# Patient Record
Sex: Male | Born: 1985 | Race: White | Hispanic: No | Smoking: Never smoker
Health system: Southern US, Community
[De-identification: ages and names within clinical notes are randomized; demographics above are authoritative.]

## PROBLEM LIST (undated history)

## (undated) DIAGNOSIS — F909 Attention-deficit hyperactivity disorder, unspecified type: Secondary | ICD-10-CM

## (undated) DIAGNOSIS — I10 Essential (primary) hypertension: Secondary | ICD-10-CM

## (undated) DIAGNOSIS — E663 Overweight: Secondary | ICD-10-CM

## (undated) HISTORY — DX: Essential (primary) hypertension: I10

## (undated) HISTORY — DX: Overweight: E66.3

## (undated) HISTORY — PX: REPAIR ANKLE LIGAMENT: SUR1187

## (undated) HISTORY — PX: FINGER CLOSED REDUCTION: SHX1633

## (undated) HISTORY — PX: CARPAL TUNNEL RELEASE: SHX101

## (undated) HISTORY — PX: ORIF ANKLE FRACTURE BIMALLEOLAR: SUR920

---

## 2019-02-09 DIAGNOSIS — M25562 Pain in left knee: Secondary | ICD-10-CM | POA: Diagnosis not present

## 2019-02-09 DIAGNOSIS — Y9373 Activity, racquet and hand sports: Secondary | ICD-10-CM | POA: Diagnosis not present

## 2019-02-15 ENCOUNTER — Other Ambulatory Visit: Payer: Self-pay | Admitting: Orthopedic Surgery

## 2019-02-15 DIAGNOSIS — M25562 Pain in left knee: Secondary | ICD-10-CM

## 2019-02-20 ENCOUNTER — Ambulatory Visit
Admission: RE | Admit: 2019-02-20 | Discharge: 2019-02-20 | Disposition: A | Discharge status: Home / Self Care | Payer: BLUE CROSS/BLUE SHIELD | Source: Ambulatory Visit | Attending: Orthopedic Surgery | Admitting: Orthopedic Surgery

## 2019-02-20 ENCOUNTER — Other Ambulatory Visit: Payer: Self-pay

## 2019-02-20 DIAGNOSIS — S82015A Nondisplaced osteochondral fracture of left patella, initial encounter for closed fracture: Secondary | ICD-10-CM | POA: Diagnosis not present

## 2019-02-20 DIAGNOSIS — M25562 Pain in left knee: Secondary | ICD-10-CM | POA: Diagnosis not present

## 2019-02-21 DIAGNOSIS — M25562 Pain in left knee: Secondary | ICD-10-CM | POA: Diagnosis not present

## 2019-02-23 ENCOUNTER — Encounter (HOSPITAL_BASED_OUTPATIENT_CLINIC_OR_DEPARTMENT_OTHER): Payer: Self-pay | Admitting: *Deleted

## 2019-02-23 ENCOUNTER — Other Ambulatory Visit: Payer: Self-pay

## 2019-02-26 ENCOUNTER — Other Ambulatory Visit: Payer: Self-pay | Admitting: Orthopedic Surgery

## 2019-02-28 NOTE — Pre-Procedure Instructions (Signed)
Pt called and re screened for s/s of Covid-19 virus. Denies: SOB, fever, chills, head or body ache's, runny nose, sneezing or sore throat. Denies any travel out of the state in the last 3 weeks. States that no one in the home has any of the above symptoms. Instructions and updated arrival time of 0645 reviewed.

## 2019-03-01 ENCOUNTER — Other Ambulatory Visit: Payer: Self-pay

## 2019-03-01 ENCOUNTER — Ambulatory Visit (HOSPITAL_BASED_OUTPATIENT_CLINIC_OR_DEPARTMENT_OTHER): Payer: BLUE CROSS/BLUE SHIELD | Admitting: Anesthesiology

## 2019-03-01 ENCOUNTER — Encounter (HOSPITAL_BASED_OUTPATIENT_CLINIC_OR_DEPARTMENT_OTHER): Payer: Self-pay | Admitting: Emergency Medicine

## 2019-03-01 ENCOUNTER — Encounter (HOSPITAL_BASED_OUTPATIENT_CLINIC_OR_DEPARTMENT_OTHER)
Admission: RE | Disposition: A | Discharge status: Home / Self Care | Payer: Self-pay | Source: Home / Self Care | Attending: Orthopedic Surgery

## 2019-03-01 ENCOUNTER — Ambulatory Visit (HOSPITAL_BASED_OUTPATIENT_CLINIC_OR_DEPARTMENT_OTHER)
Admission: RE | Admit: 2019-03-01 | Discharge: 2019-03-01 | Disposition: A | Discharge status: Home / Self Care | Payer: BLUE CROSS/BLUE SHIELD | Attending: Orthopedic Surgery | Admitting: Orthopedic Surgery

## 2019-03-01 DIAGNOSIS — M2392 Unspecified internal derangement of left knee: Secondary | ICD-10-CM | POA: Diagnosis not present

## 2019-03-01 DIAGNOSIS — M958 Other specified acquired deformities of musculoskeletal system: Secondary | ICD-10-CM | POA: Insufficient documentation

## 2019-03-01 DIAGNOSIS — M93262 Osteochondritis dissecans, left knee: Secondary | ICD-10-CM | POA: Diagnosis not present

## 2019-03-01 DIAGNOSIS — Z79899 Other long term (current) drug therapy: Secondary | ICD-10-CM | POA: Diagnosis not present

## 2019-03-01 DIAGNOSIS — F909 Attention-deficit hyperactivity disorder, unspecified type: Secondary | ICD-10-CM | POA: Diagnosis not present

## 2019-03-01 HISTORY — DX: Attention-deficit hyperactivity disorder, unspecified type: F90.9

## 2019-03-01 HISTORY — PX: KNEE ARTHROSCOPY WITH OSTEOCHONDRAL DEFECT REPAIR: SHX6579

## 2019-03-01 SURGERY — ARTHROSCOPY, KNEE, WITH OSTEOCHONDRAL DEFECT REPAIR
Anesthesia: General | Site: Knee | Laterality: Left

## 2019-03-01 MED ORDER — CHLORHEXIDINE GLUCONATE 4 % EX LIQD: 60.0000 mL | Freq: Once | CUTANEOUS | Status: DC

## 2019-03-01 MED ORDER — DEXAMETHASONE SODIUM PHOSPHATE 10 MG/ML IJ SOLN
INTRAMUSCULAR | Status: AC
  Filled 2019-03-01: qty 1

## 2019-03-01 MED ORDER — SODIUM CHLORIDE 0.9 % IR SOLN
Status: DC | PRN
  Administered 2019-03-01: 3000 mL

## 2019-03-01 MED ORDER — MIDAZOLAM HCL 5 MG/5ML IJ SOLN
INTRAMUSCULAR | Status: DC | PRN
  Administered 2019-03-01: 2 mg via INTRAVENOUS

## 2019-03-01 MED ORDER — CEFAZOLIN SODIUM-DEXTROSE 2-4 GM/100ML-% IV SOLN
INTRAVENOUS | Status: AC
  Filled 2019-03-01: qty 100

## 2019-03-01 MED ORDER — OXYCODONE HCL 5 MG PO TABS
5.0000 mg | ORAL_TABLET | Freq: Once | ORAL | Status: AC | PRN
  Administered 2019-03-01: 5 mg via ORAL

## 2019-03-01 MED ORDER — KETOROLAC TROMETHAMINE 30 MG/ML IJ SOLN
INTRAMUSCULAR | Status: DC | PRN
  Administered 2019-03-01: 30 mg via INTRAVENOUS

## 2019-03-01 MED ORDER — FENTANYL CITRATE (PF) 100 MCG/2ML IJ SOLN: 50.0000 ug | INTRAMUSCULAR | Status: DC | PRN

## 2019-03-01 MED ORDER — KETOROLAC TROMETHAMINE 30 MG/ML IJ SOLN
INTRAMUSCULAR | Status: AC
  Filled 2019-03-01: qty 1

## 2019-03-01 MED ORDER — ONDANSETRON HCL 4 MG/2ML IJ SOLN
INTRAMUSCULAR | Status: DC | PRN
  Administered 2019-03-01: 4 mg via INTRAVENOUS

## 2019-03-01 MED ORDER — FENTANYL CITRATE (PF) 100 MCG/2ML IJ SOLN
25.0000 ug | INTRAMUSCULAR | Status: DC | PRN
  Administered 2019-03-01: 25 ug via INTRAVENOUS

## 2019-03-01 MED ORDER — OXYCODONE HCL 5 MG/5ML PO SOLN: 5.0000 mg | Freq: Once | ORAL | Status: AC | PRN

## 2019-03-01 MED ORDER — MIDAZOLAM HCL 2 MG/2ML IJ SOLN: 1.0000 mg | INTRAMUSCULAR | Status: DC | PRN

## 2019-03-01 MED ORDER — FENTANYL CITRATE (PF) 100 MCG/2ML IJ SOLN
INTRAMUSCULAR | Status: DC | PRN
  Administered 2019-03-01 (×3): 25 ug via INTRAVENOUS
  Administered 2019-03-01: 50 ug via INTRAVENOUS
  Administered 2019-03-01 (×3): 25 ug via INTRAVENOUS

## 2019-03-01 MED ORDER — LIDOCAINE HCL (CARDIAC) PF 100 MG/5ML IV SOSY
PREFILLED_SYRINGE | INTRAVENOUS | Status: DC | PRN
  Administered 2019-03-01: 100 mg via INTRAVENOUS

## 2019-03-01 MED ORDER — LACTATED RINGERS IV SOLN
INTRAVENOUS | Status: DC
  Administered 2019-03-01 (×2): via INTRAVENOUS

## 2019-03-01 MED ORDER — OXYCODONE HCL 5 MG PO TABS: 5.0000 mg | ORAL_TABLET | Freq: Four times a day (QID) | ORAL | 0 refills | Status: DC | PRN

## 2019-03-01 MED ORDER — CEFAZOLIN SODIUM-DEXTROSE 2-4 GM/100ML-% IV SOLN
2.0000 g | INTRAVENOUS | Status: AC
  Administered 2019-03-01: 2 g via INTRAVENOUS

## 2019-03-01 MED ORDER — ONDANSETRON HCL 4 MG/2ML IJ SOLN
INTRAMUSCULAR | Status: AC
  Filled 2019-03-01: qty 2

## 2019-03-01 MED ORDER — FENTANYL CITRATE (PF) 100 MCG/2ML IJ SOLN
INTRAMUSCULAR | Status: AC
  Filled 2019-03-01: qty 2

## 2019-03-01 MED ORDER — DEXAMETHASONE SODIUM PHOSPHATE 4 MG/ML IJ SOLN
INTRAMUSCULAR | Status: DC | PRN
  Administered 2019-03-01: 10 mg via INTRAVENOUS

## 2019-03-01 MED ORDER — ONDANSETRON HCL 4 MG/2ML IJ SOLN: 4.0000 mg | Freq: Once | INTRAMUSCULAR | Status: DC | PRN

## 2019-03-01 MED ORDER — MIDAZOLAM HCL 2 MG/2ML IJ SOLN
INTRAMUSCULAR | Status: AC
  Filled 2019-03-01: qty 2

## 2019-03-01 MED ORDER — IBUPROFEN 800 MG PO TABS: 800.0000 mg | ORAL_TABLET | Freq: Three times a day (TID) | ORAL | 0 refills | Status: DC | PRN

## 2019-03-01 MED ORDER — SCOPOLAMINE 1 MG/3DAYS TD PT72: 1.0000 | MEDICATED_PATCH | Freq: Once | TRANSDERMAL | Status: DC | PRN

## 2019-03-01 MED ORDER — PROPOFOL 10 MG/ML IV BOLUS
INTRAVENOUS | Status: AC
  Filled 2019-03-01: qty 20

## 2019-03-01 MED ORDER — PROPOFOL 10 MG/ML IV BOLUS
INTRAVENOUS | Status: DC | PRN
  Administered 2019-03-01: 200 mg via INTRAVENOUS

## 2019-03-01 MED ORDER — OXYCODONE HCL 5 MG PO TABS
ORAL_TABLET | ORAL | Status: AC
  Filled 2019-03-01: qty 1

## 2019-03-01 MED ORDER — LIDOCAINE 2% (20 MG/ML) 5 ML SYRINGE
INTRAMUSCULAR | Status: AC
  Filled 2019-03-01: qty 5

## 2019-03-01 SURGICAL SUPPLY — 51 items
BANDAGE ACE 6X5 VEL STRL LF (GAUZE/BANDAGES/DRESSINGS) ×2 IMPLANT
BANDAGE ESMARK 6X9 LF (GAUZE/BANDAGES/DRESSINGS) IMPLANT
BLADE MINI RND TIP GREEN BEAV (BLADE) IMPLANT
BLADE SURG 15 STRL LF DISP TIS (BLADE) ×1 IMPLANT
BLADE SURG 15 STRL SS (BLADE) ×1
BNDG ESMARK 6X9 LF (GAUZE/BANDAGES/DRESSINGS)
BURR OVAL 8 FLU 5.0X13 (MISCELLANEOUS) IMPLANT
COVER WAND RF STERILE (DRAPES) IMPLANT
DISSECTOR  3.8MM X 13CM (MISCELLANEOUS)
DISSECTOR 3.8MM X 13CM (MISCELLANEOUS) IMPLANT
DRAPE ARTHROSCOPY W/POUCH 90 (DRAPES) ×2 IMPLANT
DRAPE IMP U-DRAPE 54X76 (DRAPES) ×2 IMPLANT
DRAPE U-SHAPE 47X51 STRL (DRAPES) ×2 IMPLANT
DRSG PAD ABDOMINAL 8X10 ST (GAUZE/BANDAGES/DRESSINGS) ×2 IMPLANT
DURAPREP 26ML APPLICATOR (WOUND CARE) ×2 IMPLANT
ELECT MENISCUS 165MM 90D (ELECTRODE) IMPLANT
ELECT REM PT RETURN 9FT ADLT (ELECTROSURGICAL) ×2
ELECTRODE REM PT RTRN 9FT ADLT (ELECTROSURGICAL) ×1 IMPLANT
EXCALIBUR 3.8MM X 13CM (MISCELLANEOUS) ×2 IMPLANT
GAUZE SPONGE 4X4 12PLY STRL (GAUZE/BANDAGES/DRESSINGS) ×2 IMPLANT
GAUZE XEROFORM 1X8 LF (GAUZE/BANDAGES/DRESSINGS) ×2 IMPLANT
GLOVE BIO SURGEON STRL SZ7.5 (GLOVE) ×2 IMPLANT
GLOVE BIOGEL PI IND STRL 8 (GLOVE) ×1 IMPLANT
GLOVE BIOGEL PI IND STRL 8.5 (GLOVE) ×2 IMPLANT
GLOVE BIOGEL PI INDICATOR 8 (GLOVE) ×1
GLOVE BIOGEL PI INDICATOR 8.5 (GLOVE) ×2
GLOVE SURG ORTHO 8.0 STRL STRW (GLOVE) ×4 IMPLANT
GOWN STRL REUS W/ TWL LRG LVL3 (GOWN DISPOSABLE) ×1 IMPLANT
GOWN STRL REUS W/ TWL XL LVL3 (GOWN DISPOSABLE) IMPLANT
GOWN STRL REUS W/TWL LRG LVL3 (GOWN DISPOSABLE) ×1
GOWN STRL REUS W/TWL XL LVL3 (GOWN DISPOSABLE)
IMMOBILIZER KNEE 22 UNIV (SOFTGOODS) ×2 IMPLANT
IMMOBILIZER KNEE 24 THIGH 36 (MISCELLANEOUS) IMPLANT
IMMOBILIZER KNEE 24 UNIV (MISCELLANEOUS)
KNEE WRAP E Z 3 GEL PACK (MISCELLANEOUS) ×2 IMPLANT
MANIFOLD NEPTUNE II (INSTRUMENTS) ×4 IMPLANT
PACK ARTHROSCOPY DSU (CUSTOM PROCEDURE TRAY) ×2 IMPLANT
PACK BASIN DAY SURGERY FS (CUSTOM PROCEDURE TRAY) ×2 IMPLANT
PAD CAST 4YDX4 CTTN HI CHSV (CAST SUPPLIES) ×1 IMPLANT
PADDING CAST COTTON 4X4 STRL (CAST SUPPLIES) ×1
PADDING CAST COTTON 6X4 STRL (CAST SUPPLIES) ×2 IMPLANT
PENCIL BUTTON HOLSTER BLD 10FT (ELECTRODE) IMPLANT
PORT APPOLLO RF 90DEGREE MULTI (SURGICAL WAND) ×2 IMPLANT
SPONGE LAP 4X18 RFD (DISPOSABLE) ×2 IMPLANT
SUCTION FRAZIER HANDLE 10FR (MISCELLANEOUS) ×1
SUCTION TUBE FRAZIER 10FR DISP (MISCELLANEOUS) ×1 IMPLANT
SUT ETHILON 4 0 PS 2 18 (SUTURE) ×2 IMPLANT
TOWEL GREEN STERILE FF (TOWEL DISPOSABLE) ×2 IMPLANT
TUBING ARTHROSCOPY IRRIG 16FT (MISCELLANEOUS) ×2 IMPLANT
WATER STERILE IRR 1000ML POUR (IV SOLUTION) ×2 IMPLANT
YANKAUER SUCT BULB TIP NO VENT (SUCTIONS) IMPLANT

## 2019-03-01 NOTE — Transfer of Care (Signed)
Immediate Anesthesia Transfer of Care Note  Patient: Walter Roberson  Procedure(s) Performed: Procedure(s) (LRB): LEFT KNEE ARTHROSCOPY WITH OSTEOCHARAL DEFECT FIXATION MICROFRACTURE (Left)  Patient Location: PACU  Anesthesia Type: General  Level of Consciousness: awake, sedated, patient cooperative and responds to stimulation  Airway & Oxygen Therapy: Patient Spontanous Breathing and Patient connected to  oxygen w/ cloth mask over O2  Post-op Assessment: Report given to PACU RN, Post -op Vital signs reviewed and stable and Patient moving all extremities  Post vital signs: Reviewed and stable  Complications: No apparent anesthesia complications

## 2019-03-01 NOTE — H&P (Signed)
Walter Roberson MRN:  [id number] DOB/SEX:  10-18-1986/male  CHIEF COMPLAINT:  Painful left Knee  HISTORY: Patient is a 33 y.o. male presented with a history of pain in the left knee. Onset of symptoms was abrupt starting a few weeks ago with gradually worsening course since that time. Patient has been treated conservatively with over-the-counter NSAIDs and activity modification. Patient currently rates pain in the knee at 10 out of 10 with activity. There is pain at night.  PAST MEDICAL HISTORY: There are no active problems to display for this patient.  Past Medical History:  Diagnosis Date  . ADHD    Past Surgical History:  Procedure Laterality Date  . CARPAL TUNNEL RELEASE Right   . FINGER CLOSED REDUCTION    . ORIF ANKLE FRACTURE BIMALLEOLAR Left    x2  . REPAIR ANKLE LIGAMENT Left      MEDICATIONS:   Medications Prior to Admission  Medication Sig Dispense Refill Last Dose  . amphetamine-dextroamphetamine (ADDERALL XR) 30 MG 24 hr capsule Take 30 mg by mouth daily.   02/28/2019 at Unknown time  . naproxen sodium (ALEVE) 220 MG tablet Take 220 mg by mouth 2 (two) times daily as needed.   02/25/2019    ALLERGIES:  No Known Allergies  REVIEW OF SYSTEMS:  A comprehensive review of systems was negative except for: Musculoskeletal: positive for bone pain and myalgias   FAMILY HISTORY:  History reviewed. No pertinent family history.  SOCIAL HISTORY:   Social History   Tobacco Use  . Smoking status: Never Smoker  . Smokeless tobacco: Never Used  Substance Use Topics  . Alcohol use: Yes    Comment: occas     EXAMINATION:  Vital signs in last 24 hours: Temp:  [97.5 F (36.4 C)] 97.5 F (36.4 C) (04/22 0719) Pulse Rate:  [84] 84 (04/22 0719) Resp:  [16] 16 (04/22 0719) BP: (134)/(96) 134/96 (04/22 0719) SpO2:  [99 %] 99 % (04/22 0719) Weight:  [86.6 kg] 86.6 kg (04/22 0719)  BP (!) 134/96   Pulse 84   Temp (!) 97.5 F (36.4 C) (Oral)   Resp 16   Ht 5\' 8"  (1.727 m)    Wt 86.6 kg   SpO2 99%   BMI 29.03 kg/m   General Appearance:    Alert, cooperative, no distress, appears stated age  Head:    Normocephalic, without obvious abnormality, atraumatic  Eyes:    PERRL, conjunctiva/corneas clear, EOM's intact, fundi    benign, both eyes       Ears:    Normal TM's and external ear canals, both ears  Nose:   Nares normal, septum midline, mucosa normal, no drainage    or sinus tenderness  Throat:   Lips, mucosa, and tongue normal; teeth and gums normal  Neck:   Supple, symmetrical, trachea midline, no adenopathy;       thyroid:  No enlargement/tenderness/nodules; no carotid   bruit or JVD  Back:     Symmetric, no curvature, ROM normal, no CVA tenderness  Lungs:     Clear to auscultation bilaterally, respirations unlabored  Chest wall:    No tenderness or deformity  Heart:    Regular rate and rhythm, S1 and S2 normal, no murmur, rub   or gallop  Abdomen:     Soft, non-tender, bowel sounds active all four quadrants,    no masses, no organomegaly  Genitalia:    Normal male without lesion, discharge or tenderness  Rectal:    Normal tone, normal  prostate, no masses or tenderness;   guaiac negative stool  Extremities:   Extremities normal, atraumatic, no cyanosis or edema  Pulses:   2+ and symmetric all extremities  Skin:   Skin color, texture, turgor normal, no rashes or lesions  Lymph nodes:   Cervical, supraclavicular, and axillary nodes normal  Neurologic:   CNII-XII intact. 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 The overall alignment is neutral. The bone quality appears to be good for age and reported activity level.  Assessment/Plan: OCD fracture, left knee   The patient history, physical examination and imaging studies are consistent with OCD fracture left knee. The patient has failed  conservative treatment.  The clearance notes were reviewed.  After discussion with the patient it was felt that OCD fixation was indicated. The procedure,  risks, and benefits were presented and reviewed. The patient acknowledged the explanation, agreed to proceed with the plan.   Guy SandiferColby Alan Dazani Norby 03/01/2019, 8:11 AM

## 2019-03-01 NOTE — Discharge Instructions (Signed)
  Post Anesthesia Home Care Instructions  Activity: Get plenty of rest for the remainder of the day. A responsible individual must stay with you for 24 hours following the procedure.  For the next 24 hours, DO NOT: -Drive a car -Operate machinery -Drink alcoholic beverages -Take any medication unless instructed by your physician -Make any legal decisions or sign important papers.  Meals: Start with liquid foods such as gelatin or soup. Progress to regular foods as tolerated. Avoid greasy, spicy, heavy foods. If nausea and/or vomiting occur, drink only clear liquids until the nausea and/or vomiting subsides. Call your physician if vomiting continues.  Special Instructions/Symptoms: Your throat may feel dry or sore from the anesthesia or the breathing tube placed in your throat during surgery. If this causes discomfort, gargle with warm salt water. The discomfort should disappear within 24 hours.     Regional Anesthesia Blocks  1. Numbness or the inability to move the "blocked" extremity may last from 3-48 hours after placement. The length of time depends on the medication injected and your individual response to the medication. If the numbness is not going away after 48 hours, call your surgeon.  2. The extremity that is blocked will need to be protected until the numbness is gone and the  Strength has returned. Because you cannot feel it, you will need to take extra care to avoid injury. Because it may be weak, you may have difficulty moving it or using it. You may not know what position it is in without looking at it while the block is in effect.  3. For blocks in the legs and feet, returning to weight bearing and walking needs to be done carefully. You will need to wait until the numbness is entirely gone and the strength has returned. You should be able to move your leg and foot normally before you try and bear weight or walk. You will need someone to be with you when you first try to  ensure you do not fall and possibly risk injury.  4. Bruising and tenderness at the needle site are common side effects and will resolve in a few days.  5. Persistent numbness or new problems with movement should be communicated to the surgeon or the [facility] ([phone])/ [facility] ([phone]). 

## 2019-03-01 NOTE — Anesthesia Procedure Notes (Signed)
Procedure Name: LMA Insertion Date/Time: 03/01/2019 8:57 AM Performed by: Jessica Priest, CRNA Pre-anesthesia Checklist: Patient identified, Emergency Drugs available, Suction available and Patient being monitored Patient Re-evaluated:Patient Re-evaluated prior to induction Oxygen Delivery Method: Circle system utilized Preoxygenation: Pre-oxygenation with 100% oxygen Induction Type: IV induction Ventilation: Mask ventilation without difficulty LMA: LMA inserted LMA Size: 4.0 Number of attempts: 1 Airway Equipment and Method: Bite block Placement Confirmation: positive ETCO2 and breath sounds checked- equal and bilateral Tube secured with: Tape Dental Injury: Teeth and Oropharynx as per pre-operative assessment

## 2019-03-01 NOTE — Op Note (Signed)
-  Stable CBC white attending operative note on patient Walter Roberson, [place], GER a.m. 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  Now has had mechanical symptoms and pain.  MRI scan and x-ray revealed a 9 x 12 osteochondral defect.  Description of procedure:  Patient was taken the operating room placed supine on the operating room table.  The left lower extremity was prepped and draped in usual fashion.  #11 blade was used to make inferior lateral and inferomedial portals.  Diagnostic arthroscopy revealed no other pathology in the knee other than what was on the medial femoral condyle.  I used the 3.2 mm shaver through the inferomedial portal to debride the articular cartilage which was nonviable.  Ultimately the lesion measured approximately 14 mm wide by 48mm anterior to posterior.  Thus, too large for an osteochondral autograft transplant.  I did investigate the other areas of the knee and the medial and lateral menisci were normal as was the articular cartilage on the lateral and patellofemoral surfaces.  I used the 45 degree microfracture awl and punctured 8 holes in the bed of the OCD lesion.  And I did this after using a 4.0 mm cylindrical bur to get down to a good bleeding subchondral subchondral plate.  Then lavaged and closed for nylon sutures.  Dressed with Xeroform dressing sponges sterile web roll and an Ace wrap.  Complications none drains none M dictation

## 2019-03-01 NOTE — Anesthesia Preprocedure Evaluation (Signed)
Anesthesia Evaluation  Patient identified by MRN, date of birth, ID band Patient awake    Reviewed: Allergy & Precautions, NPO status , Patient's Chart, lab work & pertinent test results  History of Anesthesia Complications Negative for: history of anesthetic complications  Airway Mallampati: I  TM Distance: >3 FB Neck ROM: Full    Dental no notable dental hx. (+) Teeth Intact   Pulmonary neg pulmonary ROS,    Pulmonary exam normal        Cardiovascular negative cardio ROS Normal cardiovascular exam     Neuro/Psych negative neurological ROS  negative psych ROS   GI/Hepatic negative GI ROS, Neg liver ROS,   Endo/Other  negative endocrine ROS  Renal/GU negative Renal ROS  negative genitourinary   Musculoskeletal negative musculoskeletal ROS (+)   Abdominal   Peds  (+) ADHD Hematology negative hematology ROS (+)   Anesthesia Other Findings   Reproductive/Obstetrics                            Anesthesia Physical Anesthesia Plan  ASA: I  Anesthesia Plan: General   Post-op Pain Management:    Induction: Intravenous  PONV Risk Score and Plan: 2 and Ondansetron, Dexamethasone, Midazolam and Treatment may vary due to age or medical condition  Airway Management Planned: LMA  Additional Equipment: None  Intra-op Plan:   Post-operative Plan: Extubation in OR  Informed Consent: I have reviewed the patients History and Physical, chart, labs and discussed the procedure including the risks, benefits and alternatives for the proposed anesthesia with the patient or authorized representative who has indicated his/her understanding and acceptance.     Dental advisory given  Plan Discussed with:   Anesthesia Plan Comments:       Anesthesia Quick Evaluation

## 2019-03-01 NOTE — Anesthesia Postprocedure Evaluation (Signed)
Anesthesia Post Note  Patient: Walter Roberson  Procedure(s) Performed: LEFT KNEE ARTHROSCOPY WITH OSTEOCHARAL DEFECT FIXATION MICROFRACTURE (Left Knee)     Patient location during evaluation: PACU Anesthesia Type: General Level of consciousness: awake and alert Pain management: pain level controlled Vital Signs Assessment: post-procedure vital signs reviewed and stable Respiratory status: spontaneous breathing, nonlabored ventilation and respiratory function stable Cardiovascular status: blood pressure returned to baseline and stable Postop Assessment: no apparent nausea or vomiting Anesthetic complications: no    Last Vitals:  Vitals:   03/01/19 1030 03/01/19 1045  BP: (!) 130/100 (!) 136/99  Pulse: 87 83  Resp: 13 16  Temp:  36.6 C  SpO2: 99% 100%    Last Pain:  Vitals:   03/01/19 1045  TempSrc:   PainSc: 6                  Lucretia Kern

## 2019-03-02 ENCOUNTER — Encounter (HOSPITAL_BASED_OUTPATIENT_CLINIC_OR_DEPARTMENT_OTHER): Payer: Self-pay | Admitting: Orthopedic Surgery

## 2019-03-06 DIAGNOSIS — R262 Difficulty in walking, not elsewhere classified: Secondary | ICD-10-CM | POA: Diagnosis not present

## 2019-03-06 DIAGNOSIS — M6281 Muscle weakness (generalized): Secondary | ICD-10-CM | POA: Diagnosis not present

## 2019-03-06 DIAGNOSIS — M25462 Effusion, left knee: Secondary | ICD-10-CM | POA: Diagnosis not present

## 2019-03-06 DIAGNOSIS — M25662 Stiffness of left knee, not elsewhere classified: Secondary | ICD-10-CM | POA: Diagnosis not present

## 2019-03-09 DIAGNOSIS — M6281 Muscle weakness (generalized): Secondary | ICD-10-CM | POA: Diagnosis not present

## 2019-03-09 DIAGNOSIS — M25662 Stiffness of left knee, not elsewhere classified: Secondary | ICD-10-CM | POA: Diagnosis not present

## 2019-03-09 DIAGNOSIS — R262 Difficulty in walking, not elsewhere classified: Secondary | ICD-10-CM | POA: Diagnosis not present

## 2019-03-09 DIAGNOSIS — M25462 Effusion, left knee: Secondary | ICD-10-CM | POA: Diagnosis not present

## 2019-03-15 DIAGNOSIS — M25662 Stiffness of left knee, not elsewhere classified: Secondary | ICD-10-CM | POA: Diagnosis not present

## 2019-03-15 DIAGNOSIS — M6281 Muscle weakness (generalized): Secondary | ICD-10-CM | POA: Diagnosis not present

## 2019-03-15 DIAGNOSIS — R262 Difficulty in walking, not elsewhere classified: Secondary | ICD-10-CM | POA: Diagnosis not present

## 2019-03-15 DIAGNOSIS — M25462 Effusion, left knee: Secondary | ICD-10-CM | POA: Diagnosis not present

## 2019-03-22 DIAGNOSIS — M25662 Stiffness of left knee, not elsewhere classified: Secondary | ICD-10-CM | POA: Diagnosis not present

## 2019-03-22 DIAGNOSIS — R262 Difficulty in walking, not elsewhere classified: Secondary | ICD-10-CM | POA: Diagnosis not present

## 2019-03-22 DIAGNOSIS — M25462 Effusion, left knee: Secondary | ICD-10-CM | POA: Diagnosis not present

## 2019-03-22 DIAGNOSIS — M6281 Muscle weakness (generalized): Secondary | ICD-10-CM | POA: Diagnosis not present

## 2019-04-12 ENCOUNTER — Ambulatory Visit: Payer: BLUE CROSS/BLUE SHIELD | Admitting: Internal Medicine

## 2019-04-12 DIAGNOSIS — M25462 Effusion, left knee: Secondary | ICD-10-CM | POA: Diagnosis not present

## 2019-04-12 DIAGNOSIS — M6281 Muscle weakness (generalized): Secondary | ICD-10-CM | POA: Diagnosis not present

## 2019-04-12 DIAGNOSIS — M25662 Stiffness of left knee, not elsewhere classified: Secondary | ICD-10-CM | POA: Diagnosis not present

## 2019-04-12 DIAGNOSIS — R262 Difficulty in walking, not elsewhere classified: Secondary | ICD-10-CM | POA: Diagnosis not present

## 2019-04-18 ENCOUNTER — Ambulatory Visit (INDEPENDENT_AMBULATORY_CARE_PROVIDER_SITE_OTHER): Payer: BC Managed Care – PPO | Admitting: Internal Medicine

## 2019-04-18 ENCOUNTER — Other Ambulatory Visit: Payer: Self-pay

## 2019-04-18 ENCOUNTER — Encounter: Payer: Self-pay | Admitting: Internal Medicine

## 2019-04-18 VITALS — BP 150/100 | HR 98 | Temp 98.3°F | Ht 68.0 in | Wt 195.4 lb

## 2019-04-18 DIAGNOSIS — E663 Overweight: Secondary | ICD-10-CM

## 2019-04-18 DIAGNOSIS — F900 Attention-deficit hyperactivity disorder, predominantly inattentive type: Secondary | ICD-10-CM

## 2019-04-18 DIAGNOSIS — I10 Essential (primary) hypertension: Secondary | ICD-10-CM | POA: Diagnosis not present

## 2019-04-18 DIAGNOSIS — F909 Attention-deficit hyperactivity disorder, unspecified type: Secondary | ICD-10-CM | POA: Insufficient documentation

## 2019-04-18 DIAGNOSIS — Z23 Encounter for immunization: Secondary | ICD-10-CM | POA: Diagnosis not present

## 2019-04-18 MED ORDER — HYDROCHLOROTHIAZIDE 25 MG PO TABS: 25.0000 mg | ORAL_TABLET | Freq: Every day | ORAL | 1 refills | Status: DC

## 2019-04-18 NOTE — Progress Notes (Signed)
New Patient Office Visit     CC/Reason for Visit: Establish care, annual refills, discuss high blood pressure Previous PCP: Unknown Last Visit: Unknown  HPI: Walter Roberson is a 33 y.o. male who is coming in today for the above mentioned reasons.  He has recently moved to [place].  He works at [employer].  His past medical history is significant for ADHD for which he takes Adderall 30 mg in the morning, he also is a little overweight.  He recently had left knee surgery due to a meniscal tear, he has done well with rehab following surgery.  Throughout his surgical process he was told on several occasions that his blood pressure was high and advised to follow-up with PCP which is the main reason for visit today.  His family history is significant for a brother with type 1 diabetes diagnosed at the age of 56, father with hypertension.  He is a never smoker, has 2 beers twice a week.   Past Medical/Surgical History: Past Medical History:  Diagnosis Date   ADHD    HTN (hypertension)    Overweight (BMI 25.0-29.9)     Past Surgical History:  Procedure Laterality Date   CARPAL TUNNEL RELEASE Right    FINGER CLOSED REDUCTION     KNEE ARTHROSCOPY WITH OSTEOCHONDRAL DEFECT REPAIR Left 03/01/2019   Procedure: LEFT KNEE ARTHROSCOPY WITH OSTEOCHARAL DEFECT FIXATION MICROFRACTURE;  Surgeon: Vickey Huger, MD;  Location: [facility];  Service: Orthopedics;  Laterality: Left;   ORIF ANKLE FRACTURE BIMALLEOLAR Left    x2   REPAIR ANKLE LIGAMENT Left     Social History:  reports that he has never smoked. He has never used smokeless tobacco. He reports current alcohol use of about 4.0 standard drinks of alcohol per week. He reports that he does not use drugs.  Allergies: No Known Allergies  Family History:  Family History  Problem Relation Age of Onset   Hypertension Father    Diabetes Brother      Current Outpatient Medications:    amphetamine-dextroamphetamine  (ADDERALL XR) 30 MG 24 hr capsule, Take 30 mg by mouth daily., Disp: , Rfl:    amphetamine-dextroamphetamine (ADDERALL XR) 30 MG 24 hr capsule, Take by mouth., Disp: , Rfl:    hydrochlorothiazide (HYDRODIURIL) 25 MG tablet, Take 1 tablet (25 mg total) by mouth daily., Disp: 90 tablet, Rfl: 1  Review of Systems:  Constitutional: Denies fever, chills, diaphoresis, appetite change and fatigue.  HEENT: Denies photophobia, eye pain, redness, hearing loss, ear pain, congestion, sore throat, rhinorrhea, sneezing, mouth sores, trouble swallowing, neck pain, neck stiffness and tinnitus.   Respiratory: Denies SOB, DOE, cough, chest tightness,  and wheezing.   Cardiovascular: Denies chest pain, palpitations and leg swelling.  Gastrointestinal: Denies nausea, vomiting, abdominal pain, diarrhea, constipation, blood in stool and abdominal distention.  Genitourinary: Denies dysuria, urgency, frequency, hematuria, flank pain and difficulty urinating.  Endocrine: Denies: hot or cold intolerance, sweats, changes in hair or nails, polyuria, polydipsia. Musculoskeletal: Denies myalgias, back pain, joint swelling, arthralgias and gait problem.  Skin: Denies pallor, rash and wound.  Neurological: Denies dizziness, seizures, syncope, weakness, light-headedness, numbness and headaches.  Hematological: Denies adenopathy. Easy bruising, personal or family bleeding history  Psychiatric/Behavioral: Denies suicidal ideation, mood changes, confusion, nervousness, sleep disturbance and agitation    Physical Exam: Vitals:   04/18/19 0738 04/18/19 0758  BP: 140/90 (!) 150/100  Pulse: 98   Temp: 98.3 F (36.8 C)   TempSrc: Oral   SpO2: 97%  Weight: 195 lb 6.4 oz (88.6 kg)   Height: _0  (1.727 m)    Body mass index is 29.71 kg/m.  Constitutional: NAD, calm, comfortable Eyes: PERRL, lids and conjunctivae normal ENMT: Mucous membranes are moist. Neck: normal, supple, no masses, no thyromegaly Respiratory:  clear to auscultation bilaterally, no wheezing, no crackles. Normal respiratory effort. No accessory muscle use.  Cardiovascular: Regular rate and rhythm, no murmurs / rubs / gallops. No extremity edema. 2+ pedal pulses. No carotid bruits.  Abdomen: no tenderness, no masses palpated. No hepatosplenomegaly. Bowel sounds positive.  Musculoskeletal: no clubbing / cyanosis. No joint deformity upper and lower extremities. Good ROM, no contractures. Normal muscle tone.  Skin: no rashes, lesions, ulcers. No induration Neurologic: CN 2-12 grossly intact. Sensation intact, DTR normal. Strength 5/5 in all 4.  Psychiatric: Normal judgment and insight. Alert and oriented x 3. Normal mood.    Impression and Plan:  Essential hypertension  -I am comfortable making this diagnosis today based on in office blood pressures as well as repeated reports of high blood pressure in the past 3 months. -Will start hydrochlorothiazide 25 mg daily, check baseline renal function and electrolytes today. -Patient has met his maximum out-of-pocket deductible for his insurance which renews on July 1, because of this he is requesting blood work from physical today, will return in 1 month for blood pressure check and physical. -Advised to do ambulatory blood pressure monitoring.  Attention deficit hyperactivity disorder (ADHD), predominantly inattentive type -He is on Adderall 30 mg daily, does not need refills today, will discuss at follow-up visit.  Overweight (BMI 25.0-29.9) -Discussed healthy lifestyle, including increased physical activity and better food choices to promote weight loss.      Patient Instructions  -Nice meeting you today!!  -Start taking HCTZ 25 mg once daily. Low sodium diet: have attached some information.  -Tetanus immunization today.  -Come back later this week fasting for blood work.  -Follow up in 1 month.   DASH Eating Plan DASH stands for "Dietary Approaches to Stop Hypertension."  The DASH eating plan is a healthy eating plan that has been shown to reduce high blood pressure (hypertension). It may also reduce your risk for type 2 diabetes, heart disease, and stroke. The DASH eating plan may also help with weight loss. What are tips for following this plan?  General guidelines  Avoid eating more than 2,300 mg (milligrams) of salt (sodium) a day. If you have hypertension, you may need to reduce your sodium intake to 1,500 mg a day.  Limit alcohol intake to no more than 1 drink a day for nonpregnant women and 2 drinks a day for men. One drink equals 12 oz of beer, 5 oz of wine, or 1 oz of hard liquor.  Work with your health care provider to maintain a healthy body weight or to lose weight. Ask what an ideal weight is for you.  Get at least 30 minutes of exercise that causes your heart to beat faster (aerobic exercise) most days of the week. Activities may include walking, swimming, or biking.  Work with your health care provider or diet and nutrition specialist (dietitian) to adjust your eating plan to your individual calorie needs. Reading food labels   Check food labels for the amount of sodium per serving. Choose foods with less than 5 percent of the Daily Value of sodium. Generally, foods with less than 300 mg of sodium per serving fit into this eating plan.  To find whole grains, look  for the word "whole" as the first word in the ingredient list. Shopping  Buy products labeled as "low-sodium" or "no salt added."  Buy fresh foods. Avoid canned foods and premade or frozen meals. Cooking  Avoid adding salt when cooking. Use salt-free seasonings or herbs instead of table salt or sea salt. Check with your health care provider or pharmacist before using salt substitutes.  Do not fry foods. Cook foods using healthy methods such as baking, boiling, grilling, and broiling instead.  Cook with heart-healthy oils, such as olive, canola, soybean, or sunflower oil. Meal  planning  Eat a balanced diet that includes: ? 5 or more servings of fruits and vegetables each day. At each meal, try to fill half of your plate with fruits and vegetables. ? Up to 6-8 servings of whole grains each day. ? Less than 6 oz of lean meat, poultry, or fish each day. A 3-oz serving of meat is about the same size as a deck of cards. One egg equals 1 oz. ? 2 servings of low-fat dairy each day. ? A serving of nuts, seeds, or beans 5 times each week. ? Heart-healthy fats. Healthy fats called Omega-3 fatty acids are found in foods such as flaxseeds and coldwater fish, like sardines, salmon, and mackerel.  Limit how much you eat of the following: ? Canned or prepackaged foods. ? Food that is high in trans fat, such as fried foods. ? Food that is high in saturated fat, such as fatty meat. ? Sweets, desserts, sugary drinks, and other foods with added sugar. ? Full-fat dairy products.  Do not salt foods before eating.  Try to eat at least 2 vegetarian meals each week.  Eat more home-cooked food and less restaurant, buffet, and fast food.  When eating at a restaurant, ask that your food be prepared with less salt or no salt, if possible. What foods are recommended? The items listed may not be a complete list. Talk with your dietitian about what dietary choices are best for you. Grains Whole-grain or whole-wheat bread. Whole-grain or whole-wheat pasta. Brown rice. [place]row. Bulgur. Whole-grain and low-sodium cereals. Pita bread. Low-fat, low-sodium crackers. Whole-wheat flour tortillas. Vegetables Fresh or frozen vegetables (raw, steamed, roasted, or grilled). Low-sodium or reduced-sodium tomato and vegetable juice. Low-sodium or reduced-sodium tomato sauce and tomato paste. Low-sodium or reduced-sodium canned vegetables. Fruits All fresh, dried, or frozen fruit. Canned fruit in natural juice (without added sugar). Meat and other protein foods Skinless chicken or [place].  Ground chicken or [place]. Pork with fat trimmed off. Fish and seafood. Egg whites. Dried beans, peas, or lentils. Unsalted nuts, nut butters, and seeds. Unsalted canned beans. Lean cuts of beef with fat trimmed off. Low-sodium, lean deli meat. Dairy Low-fat (1%) or fat-free (skim) milk. Fat-free, low-fat, or reduced-fat cheeses. Nonfat, low-sodium ricotta or cottage cheese. Low-fat or nonfat yogurt. Low-fat, low-sodium cheese. Fats and oils Soft margarine without trans fats. Vegetable oil. Low-fat, reduced-fat, or light mayonnaise and salad dressings (reduced-sodium). Canola, safflower, olive, soybean, and sunflower oils. Avocado. Seasoning and other foods Herbs. Spices. Seasoning mixes without salt. Unsalted popcorn and pretzels. Fat-free sweets. What foods are not recommended? The items listed may not be a complete list. Talk with your dietitian about what dietary choices are best for you. Grains Baked goods made with fat, such as croissants, muffins, or some breads. Dry pasta or rice meal packs. Vegetables Creamed or fried vegetables. Vegetables in a cheese sauce. Regular canned vegetables (not low-sodium or reduced-sodium). Regular canned  tomato sauce and paste (not low-sodium or reduced-sodium). Regular tomato and vegetable juice (not low-sodium or reduced-sodium). Angie Fava. Olives. Fruits Canned fruit in a light or heavy syrup. Fried fruit. Fruit in cream or butter sauce. Meat and other protein foods Fatty cuts of meat. Ribs. Fried meat. Berniece Salines. Sausage. Bologna and other processed lunch meats. Salami. Fatback. Hotdogs. Bratwurst. Salted nuts and seeds. Canned beans with added salt. Canned or smoked fish. Whole eggs or egg yolks. Chicken or [place] with skin. Dairy Whole or 2% milk, cream, and half-and-half. Whole or full-fat cream cheese. Whole-fat or sweetened yogurt. Full-fat cheese. Nondairy creamers. Whipped toppings. Processed cheese and cheese spreads. Fats and oils Butter. Stick  margarine. Lard. Shortening. Ghee. Bacon fat. Tropical oils, such as coconut, palm kernel, or palm oil. Seasoning and other foods Salted popcorn and pretzels. Onion salt, garlic salt, seasoned salt, table salt, and sea salt. Worcestershire sauce. Tartar sauce. Barbecue sauce. Teriyaki sauce. Soy sauce, including reduced-sodium. Steak sauce. Canned and packaged gravies. Fish sauce. Oyster sauce. Cocktail sauce. Horseradish that you find on the shelf. Ketchup. Mustard. Meat flavorings and tenderizers. Bouillon cubes. Hot sauce and Tabasco sauce. Premade or packaged marinades. Premade or packaged taco seasonings. Relishes. Regular salad dressings. Where to find more information:  National Heart, Lung, and [place]: https://wilson-eaton.com/  American Heart Association: www.heart.org Summary  The DASH eating plan is a healthy eating plan that has been shown to reduce high blood pressure (hypertension). It may also reduce your risk for type 2 diabetes, heart disease, and stroke.  With the DASH eating plan, you should limit salt (sodium) intake to 2,300 mg a day. If you have hypertension, you may need to reduce your sodium intake to 1,500 mg a day.  When on the DASH eating plan, aim to eat more fresh fruits and vegetables, whole grains, lean proteins, low-fat dairy, and heart-healthy fats.  Work with your health care provider or diet and nutrition specialist (dietitian) to adjust your eating plan to your individual calorie needs. This information is not intended to replace advice given to you by your health care provider. Make sure you discuss any questions you have with your health care provider. Document Released: 10/15/2011 Document Revised: 10/19/2016 Document Reviewed: 10/19/2016 Elsevier Interactive Patient Education  2019 [place], MD [facility]

## 2019-04-18 NOTE — Patient Instructions (Addendum)
-Nice meeting you today!!  -Start taking HCTZ 25 mg once daily. Low sodium diet: have attached some information.  -Tetanus immunization today.  -Come back later this week fasting for blood work.  -Follow up in 1 month.   DASH Eating Plan DASH stands for "Dietary Approaches to Stop Hypertension." The DASH eating plan is a healthy eating plan that has been shown to reduce high blood pressure (hypertension). It may also reduce your risk for type 2 diabetes, heart disease, and stroke. The DASH eating plan may also help with weight loss. What are tips for following this plan?  General guidelines  Avoid eating more than 2,300 mg (milligrams) of salt (sodium) a day. If you have hypertension, you may need to reduce your sodium intake to 1,500 mg a day.  Limit alcohol intake to no more than 1 drink a day for nonpregnant women and 2 drinks a day for men. One drink equals 12 oz of beer, 5 oz of wine, or 1 oz of hard liquor.  Work with your health care provider to maintain a healthy body weight or to lose weight. Ask what an ideal weight is for you.  Get at least 30 minutes of exercise that causes your heart to beat faster (aerobic exercise) most days of the week. Activities may include walking, swimming, or biking.  Work with your health care provider or diet and nutrition specialist (dietitian) to adjust your eating plan to your individual calorie needs. Reading food labels   Check food labels for the amount of sodium per serving. Choose foods with less than 5 percent of the Daily Value of sodium. Generally, foods with less than 300 mg of sodium per serving fit into this eating plan.  To find whole grains, look for the word "whole" as the first word in the ingredient list. Shopping  Buy products labeled as "low-sodium" or "no salt added."  Buy fresh foods. Avoid canned foods and premade or frozen meals. Cooking  Avoid adding salt when cooking. Use salt-free seasonings or herbs instead  of table salt or sea salt. Check with your health care provider or pharmacist before using salt substitutes.  Do not fry foods. Cook foods using healthy methods such as baking, boiling, grilling, and broiling instead.  Cook with heart-healthy oils, such as olive, canola, soybean, or sunflower oil. Meal planning  Eat a balanced diet that includes: ? 5 or more servings of fruits and vegetables each day. At each meal, try to fill half of your plate with fruits and vegetables. ? Up to 6-8 servings of whole grains each day. ? Less than 6 oz of lean meat, poultry, or fish each day. A 3-oz serving of meat is about the same size as a deck of cards. One egg equals 1 oz. ? 2 servings of low-fat dairy each day. ? A serving of nuts, seeds, or beans 5 times each week. ? Heart-healthy fats. Healthy fats called Omega-3 fatty acids are found in foods such as flaxseeds and coldwater fish, like sardines, salmon, and mackerel.  Limit how much you eat of the following: ? Canned or prepackaged foods. ? Food that is high in trans fat, such as fried foods. ? Food that is high in saturated fat, such as fatty meat. ? Sweets, desserts, sugary drinks, and other foods with added sugar. ? Full-fat dairy products.  Do not salt foods before eating.  Try to eat at least 2 vegetarian meals each week.  Eat more home-cooked food and less restaurant, buffet,  and fast food.  When eating at a restaurant, ask that your food be prepared with less salt or no salt, if possible. What foods are recommended? The items listed may not be a complete list. Talk with your dietitian about what dietary choices are best for you. Grains Whole-grain or whole-wheat bread. Whole-grain or whole-wheat pasta. Brown rice. [place]. Bulgur. Whole-grain and low-sodium cereals. Pita bread. Low-fat, low-sodium crackers. Whole-wheat flour tortillas. Vegetables Fresh or frozen vegetables (raw, steamed, roasted, or grilled). Low-sodium or  reduced-sodium tomato and vegetable juice. Low-sodium or reduced-sodium tomato sauce and tomato paste. Low-sodium or reduced-sodium canned vegetables. Fruits All fresh, dried, or frozen fruit. Canned fruit in natural juice (without added sugar). Meat and other protein foods Skinless chicken or [place]. Ground chicken or [place]. Pork with fat trimmed off. Fish and seafood. Egg whites. Dried beans, peas, or lentils. Unsalted nuts, nut butters, and seeds. Unsalted canned beans. Lean cuts of beef with fat trimmed off. Low-sodium, lean deli meat. Dairy Low-fat (1%) or fat-free (skim) milk. Fat-free, low-fat, or reduced-fat cheeses. Nonfat, low-sodium ricotta or cottage cheese. Low-fat or nonfat yogurt. Low-fat, low-sodium cheese. Fats and oils Soft margarine without trans fats. Vegetable oil. Low-fat, reduced-fat, or light mayonnaise and salad dressings (reduced-sodium). Canola, safflower, olive, soybean, and sunflower oils. Avocado. Seasoning and other foods Herbs. Spices. Seasoning mixes without salt. Unsalted popcorn and pretzels. Fat-free sweets. What foods are not recommended? The items listed may not be a complete list. Talk with your dietitian about what dietary choices are best for you. Grains Baked goods made with fat, such as croissants, muffins, or some breads. Dry pasta or rice meal packs. Vegetables Creamed or fried vegetables. Vegetables in a cheese sauce. Regular canned vegetables (not low-sodium or reduced-sodium). Regular canned tomato sauce and paste (not low-sodium or reduced-sodium). Regular tomato and vegetable juice (not low-sodium or reduced-sodium). Angie Fava. Olives. Fruits Canned fruit in a light or heavy syrup. Fried fruit. Fruit in cream or butter sauce. Meat and other protein foods Fatty cuts of meat. Ribs. Fried meat. Berniece Salines. Sausage. Bologna and other processed lunch meats. Salami. Fatback. Hotdogs. Bratwurst. Salted nuts and seeds. Canned beans with added salt. Canned or  smoked fish. Whole eggs or egg yolks. Chicken or [place] with skin. Dairy Whole or 2% milk, cream, and half-and-half. Whole or full-fat cream cheese. Whole-fat or sweetened yogurt. Full-fat cheese. Nondairy creamers. Whipped toppings. Processed cheese and cheese spreads. Fats and oils Butter. Stick margarine. Lard. Shortening. Ghee. Bacon fat. Tropical oils, such as coconut, palm kernel, or palm oil. Seasoning and other foods Salted popcorn and pretzels. Onion salt, garlic salt, seasoned salt, table salt, and sea salt. Worcestershire sauce. Tartar sauce. Barbecue sauce. Teriyaki sauce. Soy sauce, including reduced-sodium. Steak sauce. Canned and packaged gravies. Fish sauce. Oyster sauce. Cocktail sauce. Horseradish that you find on the shelf. Ketchup. Mustard. Meat flavorings and tenderizers. Bouillon cubes. Hot sauce and Tabasco sauce. Premade or packaged marinades. Premade or packaged taco seasonings. Relishes. Regular salad dressings. Where to find more information:  National Heart, Lung, and [place]: https://wilson-eaton.com/  American Heart Association: www.heart.org Summary  The DASH eating plan is a healthy eating plan that has been shown to reduce high blood pressure (hypertension). It may also reduce your risk for type 2 diabetes, heart disease, and stroke.  With the DASH eating plan, you should limit salt (sodium) intake to 2,300 mg a day. If you have hypertension, you may need to reduce your sodium intake to 1,500 mg a day.  When on  the DASH eating plan, aim to eat more fresh fruits and vegetables, whole grains, lean proteins, low-fat dairy, and heart-healthy fats.  Work with your health care provider or diet and nutrition specialist (dietitian) to adjust your eating plan to your individual calorie needs. This information is not intended to replace advice given to you by your health care provider. Make sure you discuss any questions you have with your health care provider. Document  Released: 10/15/2011 Document Revised: 10/19/2016 Document Reviewed: 10/19/2016 Elsevier Interactive Patient Education  2019 [employer]

## 2019-04-18 NOTE — Addendum Note (Signed)
Addended by: Westley Hummer B on: 04/18/2019 08:52 AM   Modules accepted: Orders

## 2019-04-19 DIAGNOSIS — M6281 Muscle weakness (generalized): Secondary | ICD-10-CM | POA: Diagnosis not present

## 2019-04-19 DIAGNOSIS — M25462 Effusion, left knee: Secondary | ICD-10-CM | POA: Diagnosis not present

## 2019-04-19 DIAGNOSIS — M25662 Stiffness of left knee, not elsewhere classified: Secondary | ICD-10-CM | POA: Diagnosis not present

## 2019-04-19 DIAGNOSIS — R262 Difficulty in walking, not elsewhere classified: Secondary | ICD-10-CM | POA: Diagnosis not present

## 2019-04-20 ENCOUNTER — Other Ambulatory Visit: Payer: Self-pay

## 2019-04-20 ENCOUNTER — Other Ambulatory Visit (INDEPENDENT_AMBULATORY_CARE_PROVIDER_SITE_OTHER): Payer: BC Managed Care – PPO

## 2019-04-20 DIAGNOSIS — I10 Essential (primary) hypertension: Secondary | ICD-10-CM

## 2019-04-21 LAB — COMPREHENSIVE METABOLIC PANEL
ALT: 34 U/L (ref 0–53)
AST: 23 U/L (ref 0–37)
Albumin: 4.8 g/dL (ref 3.5–5.2)
Alkaline Phosphatase: 90 U/L (ref 39–117)
BUN: 16 mg/dL (ref 6–23)
CO2: 29 mEq/L (ref 19–32)
Calcium: 10 mg/dL (ref 8.4–10.5)
Chloride: 99 mEq/L (ref 96–112)
Creatinine, Ser: 1.33 mg/dL (ref 0.40–1.50)
GFR: 61.59 mL/min (ref >60.00)
Glucose, Bld: 91 mg/dL (ref 70–99)
Potassium: 4.3 mEq/L (ref 3.5–5.1)
Sodium: 139 mEq/L (ref 135–145)
Total Bilirubin: 0.9 mg/dL (ref 0.2–1.2)
Total Protein: 7.3 g/dL (ref 6.0–8.3)

## 2019-04-21 LAB — HEMOGLOBIN A1C: Hgb A1c MFr Bld: 5.3 % (ref 4.6–6.5)

## 2019-04-21 LAB — CBC WITH DIFFERENTIAL/PLATELET
Basophils Absolute: 0.1 10*3/uL (ref 0.0–0.1)
Basophils Relative: 0.9 % (ref 0.0–3.0)
Eosinophils Absolute: 0.1 10*3/uL (ref 0.0–0.7)
Eosinophils Relative: 1.5 % (ref 0.0–5.0)
HCT: 46.9 % (ref 39.0–52.0)
Hemoglobin: 16.3 g/dL (ref 13.0–17.0)
Lymphocytes Relative: 23.3 % (ref 12.0–46.0)
Lymphs Abs: 2 10*3/uL (ref 0.7–4.0)
MCHC: 34.7 g/dL (ref 30.0–36.0)
MCV: 92 fl (ref 78.0–100.0)
Monocytes Absolute: 0.6 10*3/uL (ref 0.1–1.0)
Monocytes Relative: 6.7 % (ref 3.0–12.0)
Neutro Abs: 5.8 10*3/uL (ref 1.4–7.7)
Neutrophils Relative %: 67.6 % (ref 43.0–77.0)
Platelets: 230 10*3/uL (ref 150.0–400.0)
RBC: 5.1 Mil/uL (ref 4.22–5.81)
RDW: 12.9 % (ref 11.5–15.5)
WBC: 8.6 10*3/uL (ref 4.0–10.5)

## 2019-04-21 LAB — LIPID PANEL
Cholesterol: 220 mg/dL — ABNORMAL HIGH (ref 0–200)
HDL: 57.8 mg/dL (ref >39.00)
LDL Cholesterol: 144 mg/dL — ABNORMAL HIGH (ref 0–99)
NonHDL: 162.13
Total CHOL/HDL Ratio: 4
Triglycerides: 92 mg/dL (ref 0.0–149.0)
VLDL: 18.4 mg/dL (ref 0.0–40.0)

## 2019-04-21 LAB — VITAMIN B12: Vitamin B-12: 398 pg/mL (ref 211–911)

## 2019-04-21 LAB — VITAMIN D 25 HYDROXY (VIT D DEFICIENCY, FRACTURES): VITD: 42.91 ng/mL (ref 30.00–100.00)

## 2019-04-21 LAB — TSH: TSH: 1.8 u[IU]/mL (ref 0.35–4.50)

## 2019-04-25 ENCOUNTER — Encounter: Payer: Self-pay | Admitting: Internal Medicine

## 2019-04-26 DIAGNOSIS — M25662 Stiffness of left knee, not elsewhere classified: Secondary | ICD-10-CM | POA: Diagnosis not present

## 2019-04-26 DIAGNOSIS — R262 Difficulty in walking, not elsewhere classified: Secondary | ICD-10-CM | POA: Diagnosis not present

## 2019-04-26 DIAGNOSIS — M25462 Effusion, left knee: Secondary | ICD-10-CM | POA: Diagnosis not present

## 2019-04-26 DIAGNOSIS — M6281 Muscle weakness (generalized): Secondary | ICD-10-CM | POA: Diagnosis not present

## 2019-05-03 DIAGNOSIS — M25462 Effusion, left knee: Secondary | ICD-10-CM | POA: Diagnosis not present

## 2019-05-03 DIAGNOSIS — R262 Difficulty in walking, not elsewhere classified: Secondary | ICD-10-CM | POA: Diagnosis not present

## 2019-05-03 DIAGNOSIS — M6281 Muscle weakness (generalized): Secondary | ICD-10-CM | POA: Diagnosis not present

## 2019-05-03 DIAGNOSIS — M25662 Stiffness of left knee, not elsewhere classified: Secondary | ICD-10-CM | POA: Diagnosis not present

## 2019-05-10 DIAGNOSIS — M25662 Stiffness of left knee, not elsewhere classified: Secondary | ICD-10-CM | POA: Diagnosis not present

## 2019-05-10 DIAGNOSIS — M25462 Effusion, left knee: Secondary | ICD-10-CM | POA: Diagnosis not present

## 2019-05-10 DIAGNOSIS — R262 Difficulty in walking, not elsewhere classified: Secondary | ICD-10-CM | POA: Diagnosis not present

## 2019-05-10 DIAGNOSIS — M6281 Muscle weakness (generalized): Secondary | ICD-10-CM | POA: Diagnosis not present

## 2019-05-16 ENCOUNTER — Encounter: Payer: Self-pay | Admitting: Internal Medicine

## 2019-05-16 NOTE — Telephone Encounter (Signed)
Last office visit 04/18/2019

## 2019-05-17 DIAGNOSIS — R262 Difficulty in walking, not elsewhere classified: Secondary | ICD-10-CM | POA: Diagnosis not present

## 2019-05-17 DIAGNOSIS — M25662 Stiffness of left knee, not elsewhere classified: Secondary | ICD-10-CM | POA: Diagnosis not present

## 2019-05-17 DIAGNOSIS — M6281 Muscle weakness (generalized): Secondary | ICD-10-CM | POA: Diagnosis not present

## 2019-05-17 DIAGNOSIS — M25462 Effusion, left knee: Secondary | ICD-10-CM | POA: Diagnosis not present

## 2019-05-18 MED ORDER — AMPHETAMINE-DEXTROAMPHET ER 30 MG PO CP24: 30.0000 mg | ORAL_CAPSULE | Freq: Every day | ORAL | 0 refills | Status: DC

## 2019-05-18 MED ORDER — AMPHETAMINE-DEXTROAMPHET ER 30 MG PO CP24: 30.0000 mg | ORAL_CAPSULE | ORAL | 0 refills | Status: DC

## 2019-05-24 DIAGNOSIS — M25462 Effusion, left knee: Secondary | ICD-10-CM | POA: Diagnosis not present

## 2019-05-24 DIAGNOSIS — M25662 Stiffness of left knee, not elsewhere classified: Secondary | ICD-10-CM | POA: Diagnosis not present

## 2019-05-24 DIAGNOSIS — M6281 Muscle weakness (generalized): Secondary | ICD-10-CM | POA: Diagnosis not present

## 2019-05-24 DIAGNOSIS — R262 Difficulty in walking, not elsewhere classified: Secondary | ICD-10-CM | POA: Diagnosis not present

## 2019-07-14 ENCOUNTER — Encounter: Payer: Self-pay | Admitting: Internal Medicine

## 2019-07-14 ENCOUNTER — Other Ambulatory Visit: Payer: Self-pay | Admitting: Internal Medicine

## 2019-07-21 DIAGNOSIS — M1712 Unilateral primary osteoarthritis, left knee: Secondary | ICD-10-CM | POA: Diagnosis not present

## 2019-08-08 DIAGNOSIS — Z20828 Contact with and (suspected) exposure to other viral communicable diseases: Secondary | ICD-10-CM | POA: Diagnosis not present

## 2019-08-08 DIAGNOSIS — J039 Acute tonsillitis, unspecified: Secondary | ICD-10-CM | POA: Diagnosis not present

## 2019-08-28 ENCOUNTER — Encounter: Payer: Self-pay | Admitting: Internal Medicine

## 2019-09-05 ENCOUNTER — Ambulatory Visit (INDEPENDENT_AMBULATORY_CARE_PROVIDER_SITE_OTHER): Payer: BC Managed Care – PPO | Admitting: Internal Medicine

## 2019-09-05 ENCOUNTER — Other Ambulatory Visit: Payer: Self-pay

## 2019-09-05 DIAGNOSIS — F900 Attention-deficit hyperactivity disorder, predominantly inattentive type: Secondary | ICD-10-CM

## 2019-09-05 DIAGNOSIS — I10 Essential (primary) hypertension: Secondary | ICD-10-CM

## 2019-09-05 MED ORDER — AMPHETAMINE-DEXTROAMPHET ER 30 MG PO CP24: 30.0000 mg | ORAL_CAPSULE | ORAL | 0 refills | Status: DC

## 2019-09-05 MED ORDER — AMPHETAMINE-DEXTROAMPHET ER 30 MG PO CP24: 30.0000 mg | ORAL_CAPSULE | Freq: Every day | ORAL | 0 refills | Status: DC

## 2019-09-05 NOTE — Progress Notes (Signed)
Virtual Visit via Video Note  I connected with Walter Roberson on 09/05/19 at  2:30 PM EDT by a video enabled telemedicine application and verified that I am speaking with the correct person using two identifiers.  Location patient: home Location provider: work office Persons participating in the virtual visit: patient, provider  I discussed the limitations of evaluation and management by telemedicine and the availability of in person appointments. The patient expressed understanding and agreed to proceed.   HPI: He has scheduled this visit for BP follow up and for Adderall refills. He was started on HCTZ 25 mg at last visit. States BP at home has been in the 120/80s. Takes Adderall 30 mg q am for his ADHD. Tells me his knee is still causing issues and he will be going in for an MRI soon. His ortho is talking about possibly a knee replacement.    ROS: Constitutional: Denies fever, chills, diaphoresis, appetite change and fatigue.  HEENT: Denies photophobia, eye pain, redness, hearing loss, ear pain, congestion, sore throat, rhinorrhea, sneezing, mouth sores, trouble swallowing, neck pain, neck stiffness and tinnitus.   Respiratory: Denies SOB, DOE, cough, chest tightness,  and wheezing.   Cardiovascular: Denies chest pain, palpitations and leg swelling.  Gastrointestinal: Denies nausea, vomiting, abdominal pain, diarrhea, constipation, blood in stool and abdominal distention.  Genitourinary: Denies dysuria, urgency, frequency, hematuria, flank pain and difficulty urinating.  Endocrine: Denies: hot or cold intolerance, sweats, changes in hair or nails, polyuria, polydipsia. Musculoskeletal: Denies myalgias, back pain. Skin: Denies pallor, rash and wound.  Neurological: Denies dizziness, seizures, syncope, weakness, light-headedness, numbness and headaches.  Hematological: Denies adenopathy. Easy bruising, personal or family bleeding history  Psychiatric/Behavioral: Denies suicidal  ideation, mood changes, confusion, nervousness, sleep disturbance and agitation   Past Medical History:  Diagnosis Date  . ADHD   . HTN (hypertension)   . Overweight (BMI 25.0-29.9)     Past Surgical History:  Procedure Laterality Date  . CARPAL TUNNEL RELEASE Right   . FINGER CLOSED REDUCTION    . KNEE ARTHROSCOPY WITH OSTEOCHONDRAL DEFECT REPAIR Left 03/01/2019   Procedure: LEFT KNEE ARTHROSCOPY WITH OSTEOCHARAL DEFECT FIXATION MICROFRACTURE;  Surgeon: Dannielle Huh, MD;  Location: [facility];  Service: Orthopedics;  Laterality: Left;  . ORIF ANKLE FRACTURE BIMALLEOLAR Left    x2  . REPAIR ANKLE LIGAMENT Left     Family History  Problem Relation Age of Onset  . Hypertension Father   . Diabetes Brother     SOCIAL HX:   reports that he has never smoked. He has never used smokeless tobacco. He reports current alcohol use of about 4.0 standard drinks of alcohol per week. He reports that he does not use drugs.   Current Outpatient Medications:  .  amphetamine-dextroamphetamine (ADDERALL XR) 30 MG 24 hr capsule, Take 1 capsule (30 mg total) by mouth every morning., Disp: 30 capsule, Rfl: 0 .  amphetamine-dextroamphetamine (ADDERALL XR) 30 MG 24 hr capsule, Take 1 capsule (30 mg total) by mouth daily., Disp: 30 capsule, Rfl: 0 .  amphetamine-dextroamphetamine (ADDERALL XR) 30 MG 24 hr capsule, Take 1 capsule (30 mg total) by mouth daily., Disp: 30 capsule, Rfl: 0 .  hydrochlorothiazide (HYDRODIURIL) 25 MG tablet, Take 1 tablet (25 mg total) by mouth daily., Disp: 90 tablet, Rfl: 1  EXAM:   VITALS per patient if applicable: none reported  GENERAL: alert, oriented, appears well and in no acute distress  HEENT: atraumatic, conjunttiva clear, no obvious abnormalities on inspection  of external nose and ears  NECK: normal movements of the head and neck  LUNGS: on inspection no signs of respiratory distress, breathing rate appears normal, no obvious gross increased  work of breathing, gasping or wheezing  CV: no obvious cyanosis  MS: moves all visible extremities without noticeable abnormality  PSYCH/NEURO: pleasant and cooperative, no obvious depression or anxiety, speech and thought processing grossly intact  ASSESSMENT AND PLAN:   Essential hypertension -Reported good control. -In office visit in 3 months for BP check and BMET to check renal function and electrolytes on diuretic therapy.  Attention deficit hyperactivity disorder (ADHD), predominantly inattentive type -PDMP reviewed; no red flags ORS 280. -Refill Adderall x 3 months.    I discussed the assessment and treatment plan with the patient. The patient was provided an opportunity to ask questions and all were answered. The patient agreed with the plan and demonstrated an understanding of the instructions.   The patient was advised to call back or seek an in-person evaluation if the symptoms worsen or if the condition fails to improve as anticipated.    Lelon Frohlich, MD  [facility]

## 2019-10-02 ENCOUNTER — Other Ambulatory Visit: Payer: Self-pay | Admitting: Internal Medicine

## 2019-10-02 DIAGNOSIS — F900 Attention-deficit hyperactivity disorder, predominantly inattentive type: Secondary | ICD-10-CM

## 2019-10-03 ENCOUNTER — Encounter: Payer: Self-pay | Admitting: Internal Medicine

## 2019-10-03 ENCOUNTER — Other Ambulatory Visit: Payer: Self-pay | Admitting: Internal Medicine

## 2019-10-03 DIAGNOSIS — F900 Attention-deficit hyperactivity disorder, predominantly inattentive type: Secondary | ICD-10-CM

## 2019-10-03 NOTE — Telephone Encounter (Signed)
Spoke to pharmacist Colon Branch at [facility] and a new e-script will have to be sent to approve an early refill.

## 2019-10-04 MED ORDER — AMPHETAMINE-DEXTROAMPHET ER 30 MG PO CP24: 30.0000 mg | ORAL_CAPSULE | ORAL | 0 refills | Status: DC

## 2019-10-07 DIAGNOSIS — Z20828 Contact with and (suspected) exposure to other viral communicable diseases: Secondary | ICD-10-CM | POA: Diagnosis not present

## 2019-10-07 DIAGNOSIS — J0100 Acute maxillary sinusitis, unspecified: Secondary | ICD-10-CM | POA: Diagnosis not present

## 2019-10-14 ENCOUNTER — Encounter (HOSPITAL_COMMUNITY): Payer: Self-pay | Admitting: Emergency Medicine

## 2019-10-14 ENCOUNTER — Other Ambulatory Visit: Payer: Self-pay

## 2019-10-14 ENCOUNTER — Emergency Department (HOSPITAL_COMMUNITY)
Admission: EM | Admit: 2019-10-14 | Discharge: 2019-10-14 | Disposition: A | Discharge status: Home / Self Care | Payer: BLUE CROSS/BLUE SHIELD | Attending: Emergency Medicine | Admitting: Emergency Medicine

## 2019-10-14 ENCOUNTER — Emergency Department (HOSPITAL_BASED_OUTPATIENT_CLINIC_OR_DEPARTMENT_OTHER): Payer: BLUE CROSS/BLUE SHIELD

## 2019-10-14 ENCOUNTER — Emergency Department (HOSPITAL_COMMUNITY): Payer: BLUE CROSS/BLUE SHIELD

## 2019-10-14 DIAGNOSIS — M791 Myalgia, unspecified site: Secondary | ICD-10-CM

## 2019-10-14 DIAGNOSIS — R531 Weakness: Secondary | ICD-10-CM | POA: Diagnosis not present

## 2019-10-14 DIAGNOSIS — R000 Tachycardia, unspecified: Secondary | ICD-10-CM | POA: Diagnosis not present

## 2019-10-14 DIAGNOSIS — R05 Cough: Secondary | ICD-10-CM | POA: Insufficient documentation

## 2019-10-14 DIAGNOSIS — F909 Attention-deficit hyperactivity disorder, unspecified type: Secondary | ICD-10-CM | POA: Insufficient documentation

## 2019-10-14 DIAGNOSIS — Z79899 Other long term (current) drug therapy: Secondary | ICD-10-CM | POA: Insufficient documentation

## 2019-10-14 DIAGNOSIS — R509 Fever, unspecified: Secondary | ICD-10-CM | POA: Insufficient documentation

## 2019-10-14 DIAGNOSIS — U071 COVID-19: Secondary | ICD-10-CM | POA: Diagnosis not present

## 2019-10-14 DIAGNOSIS — R609 Edema, unspecified: Secondary | ICD-10-CM | POA: Diagnosis not present

## 2019-10-14 DIAGNOSIS — I10 Essential (primary) hypertension: Secondary | ICD-10-CM | POA: Insufficient documentation

## 2019-10-14 DIAGNOSIS — M7918 Myalgia, other site: Secondary | ICD-10-CM | POA: Insufficient documentation

## 2019-10-14 DIAGNOSIS — R059 Cough, unspecified: Secondary | ICD-10-CM

## 2019-10-14 DIAGNOSIS — R5383 Other fatigue: Secondary | ICD-10-CM | POA: Insufficient documentation

## 2019-10-14 LAB — CBC WITH DIFFERENTIAL/PLATELET
Abs Immature Granulocytes: 0.02 10*3/uL (ref 0.00–0.07)
Basophils Absolute: 0 10*3/uL (ref 0.0–0.1)
Basophils Relative: 0 %
Eosinophils Absolute: 0 10*3/uL (ref 0.0–0.5)
Eosinophils Relative: 0 %
HCT: 48.4 % (ref 39.0–52.0)
Hemoglobin: 16.8 g/dL (ref 13.0–17.0)
Immature Granulocytes: 0 %
Lymphocytes Relative: 11 %
Lymphs Abs: 0.6 10*3/uL — ABNORMAL LOW (ref 0.7–4.0)
MCH: 31.2 pg (ref 26.0–34.0)
MCHC: 34.7 g/dL (ref 30.0–36.0)
MCV: 89.8 fL (ref 80.0–100.0)
Monocytes Absolute: 0.3 10*3/uL (ref 0.1–1.0)
Monocytes Relative: 6 %
Neutro Abs: 4.6 10*3/uL (ref 1.7–7.7)
Neutrophils Relative %: 83 %
Platelets: 150 10*3/uL (ref 150–400)
RBC: 5.39 MIL/uL (ref 4.22–5.81)
RDW: 12.1 % (ref 11.5–15.5)
WBC: 5.6 10*3/uL (ref 4.0–10.5)
nRBC: 0 % (ref 0.0–0.2)

## 2019-10-14 LAB — COMPREHENSIVE METABOLIC PANEL
ALT: 37 U/L (ref 0–44)
AST: 36 U/L (ref 15–41)
Albumin: 3.7 g/dL (ref 3.5–5.0)
Alkaline Phosphatase: 61 U/L (ref 38–126)
Anion gap: 11 (ref 5–15)
BUN: 11 mg/dL (ref 6–20)
CO2: 27 mmol/L (ref 22–32)
Calcium: 8.8 mg/dL — ABNORMAL LOW (ref 8.9–10.3)
Chloride: 95 mmol/L — ABNORMAL LOW (ref 98–111)
Creatinine, Ser: 1.17 mg/dL (ref 0.61–1.24)
GFR calc Af Amer: >60 mL/min (ref >60)
GFR calc non Af Amer: >60 mL/min (ref >60)
Glucose, Bld: 141 mg/dL — ABNORMAL HIGH (ref 70–99)
Potassium: 3.9 mmol/L (ref 3.5–5.1)
Sodium: 133 mmol/L — ABNORMAL LOW (ref 135–145)
Total Bilirubin: 0.9 mg/dL (ref 0.3–1.2)
Total Protein: 7.3 g/dL (ref 6.5–8.1)

## 2019-10-14 LAB — LACTIC ACID, PLASMA
Lactic Acid, Venous: 1.5 mmol/L (ref 0.5–1.9)
Lactic Acid, Venous: 2.1 mmol/L (ref 0.5–1.9)

## 2019-10-14 LAB — TROPONIN I (HIGH SENSITIVITY): Troponin I (High Sensitivity): 6 ng/L (ref <18)

## 2019-10-14 LAB — D-DIMER, QUANTITATIVE: D-Dimer, Quant: 0.66 ug/mL-FEU — ABNORMAL HIGH (ref 0.00–0.50)

## 2019-10-14 MED ORDER — SODIUM CHLORIDE 0.9 % IV BOLUS
500.0000 mL | Freq: Once | INTRAVENOUS | Status: AC
  Administered 2019-10-14: 500 mL via INTRAVENOUS

## 2019-10-14 MED ORDER — ALBUTEROL SULFATE HFA 108 (90 BASE) MCG/ACT IN AERS
1.0000 | INHALATION_SPRAY | Freq: Once | RESPIRATORY_TRACT | Status: AC
  Administered 2019-10-14: 1 via RESPIRATORY_TRACT
  Filled 2019-10-14: qty 6.7

## 2019-10-14 MED ORDER — BENZONATATE 100 MG PO CAPS: 100.0000 mg | ORAL_CAPSULE | Freq: Three times a day (TID) | ORAL | 0 refills | Status: DC | PRN

## 2019-10-14 MED ORDER — IOHEXOL 350 MG/ML SOLN
100.0000 mL | Freq: Once | INTRAVENOUS | Status: AC | PRN
  Administered 2019-10-14: 61 mL via INTRAVENOUS

## 2019-10-14 MED ORDER — ACETAMINOPHEN 500 MG PO TABS
1000.0000 mg | ORAL_TABLET | Freq: Once | ORAL | Status: AC
  Administered 2019-10-14: 1000 mg via ORAL
  Filled 2019-10-14: qty 2

## 2019-10-14 MED ORDER — SODIUM CHLORIDE 0.9% FLUSH: 3.0000 mL | Freq: Once | INTRAVENOUS | Status: DC

## 2019-10-14 NOTE — ED Notes (Signed)
Patient verbalizes understanding of discharge instructions . Opportunity for questions and answers were provided . Armband removed by staff ,Pt discharged from ED. W/C  offered at D/C  and Declined W/C at D/C and was escorted to lobby by RN.  

## 2019-10-14 NOTE — ED Notes (Signed)
Patient transported to CT 

## 2019-10-14 NOTE — ED Provider Notes (Signed)
[facility] EMERGENCY DEPARTMENT Provider Note   CSN: [id number] Arrival date & time: 10/14/19  1422     History   Chief Complaint Chief Complaint  Patient presents with   covid positive    HPI Walter Roberson is a 33 y.o. male with history of ADHD and hypertension presents for evaluation of acute onset, progressively worsening fevers, myalgias, cough for 9 days.  Reports that he tested positive for Covid at an urgent care in [facility] where his chief complaint was sore throat and nasal congestion for 2 days.  Since then he has had fevers up to 103 F, cough that became productive of blood-streaked sputum today, generalized body aches, nausea due to persistent cough but no emesis or abdominal pain.  Reports that 4 days ago he developed sharp left-sided chest pains that worsen with certain movements such as bending and with cough.  Notes dyspnea on exertion, generalized weakness and fatigue.  He has been taking Tylenol and was prescribed an antibiotic for what was a presumed sinus infection at the urgent care 7 days ago.  He is a non-smoker, denies recreational drug use.  He drove to [facility] and back, approximately 6 and half hours but did state that he stopped for gas once.     The history is provided by the patient.    Past Medical History:  Diagnosis Date   ADHD    HTN (hypertension)    Overweight (BMI 25.0-29.9)     Patient Active Problem List   Diagnosis Date Noted   HTN (hypertension) 04/18/2019   ADHD (attention deficit hyperactivity disorder) 04/18/2019   Overweight (BMI 25.0-29.9) 04/18/2019    Past Surgical History:  Procedure Laterality Date   CARPAL TUNNEL RELEASE Right    FINGER CLOSED REDUCTION     KNEE ARTHROSCOPY WITH OSTEOCHONDRAL DEFECT REPAIR Left 03/01/2019   Procedure: LEFT KNEE ARTHROSCOPY WITH OSTEOCHARAL DEFECT FIXATION MICROFRACTURE;  Surgeon: Dannielle Huh, MD;  Location: [facility];   Service: Orthopedics;  Laterality: Left;   ORIF ANKLE FRACTURE BIMALLEOLAR Left    x2   REPAIR ANKLE LIGAMENT Left         Home Medications    Prior to Admission medications   Medication Sig Start Date End Date Taking? Authorizing Provider  acetaminophen (TYLENOL) 325 MG tablet Take 650 mg by mouth every 6 (six) hours as needed for mild pain, fever or headache.   Yes [provider]  amphetamine-dextroamphetamine (ADDERALL XR) 30 MG 24 hr capsule Take 1 capsule (30 mg total) by mouth every morning. 10/04/19  Yes Philip Aspen, Limmie Patricia, MD  hydrochlorothiazide (HYDRODIURIL) 25 MG tablet Take 1 tablet (25 mg total) by mouth daily. 04/18/19  Yes Philip Aspen, Limmie Patricia, MD  meloxicam (MOBIC) 15 MG tablet Take 15 mg by mouth daily. 09/25/19  Yes [provider]  benzonatate (TESSALON) 100 MG capsule Take 1 capsule (100 mg total) by mouth 3 (three) times daily as needed for cough. 10/14/19   Jeanie Sewer, PA-C    Family History Family History  Problem Relation Age of Onset   Hypertension Father    Diabetes Brother     Social History Social History   Tobacco Use   Smoking status: Never Smoker   Smokeless tobacco: Never Used  Substance Use Topics   Alcohol use: Yes    Alcohol/week: 4.0 standard drinks    Types: 4 Cans of beer per week    Comment: a week   Drug  use: Never     Allergies   Patient has no known allergies.   Review of Systems Review of Systems  Constitutional: Positive for chills, fatigue and fever.  Respiratory: Positive for cough and shortness of breath.   Cardiovascular: Positive for chest pain. Negative for leg swelling.  Gastrointestinal: Positive for nausea. Negative for vomiting.  Musculoskeletal: Positive for myalgias.  All other systems reviewed and are negative.    Physical Exam Updated Vital Signs BP 127/82    Pulse 92    Temp (!) 101.5 F (38.6 C) (Oral)    Resp 19    SpO2 97%   Physical Exam Vitals signs  and nursing note reviewed.  Constitutional:      General: He is not in acute distress.    Appearance: He is well-developed.  HENT:     Head: Normocephalic and atraumatic.  Eyes:     General:        Right eye: No discharge.        Left eye: No discharge.     Conjunctiva/sclera: Conjunctivae normal.  Neck:     Vascular: No JVD.     Trachea: No tracheal deviation.  Cardiovascular:     Rate and Rhythm: Normal rate.     Pulses: Normal pulses.     Comments: Homan's sign present bilaterally  Pulmonary:     Comments: Tachypneic, speaking in full sentences without difficulty Chest:     Chest wall: No tenderness.  Abdominal:     General: Abdomen is flat. Bowel sounds are normal. There is no distension.     Palpations: Abdomen is soft.     Tenderness: There is no abdominal tenderness. There is no guarding or rebound.  Skin:    General: Skin is warm and dry.     Findings: No erythema.  Neurological:     Mental Status: He is alert.  Psychiatric:        Behavior: Behavior normal.      ED Treatments / Results  Labs (all labs ordered are listed, but only abnormal results are displayed) Labs Reviewed  LACTIC ACID, PLASMA - Abnormal; Notable for the following components:      Result Value   Lactic Acid, Venous 2.1 (*)    All other components within normal limits  COMPREHENSIVE METABOLIC PANEL - Abnormal; Notable for the following components:   Sodium 133 (*)    Chloride 95 (*)    Glucose, Bld 141 (*)    Calcium 8.8 (*)    All other components within normal limits  CBC WITH DIFFERENTIAL/PLATELET - Abnormal; Notable for the following components:   Lymphs Abs 0.6 (*)    All other components within normal limits  D-DIMER, QUANTITATIVE (NOT AT [facility]) - Abnormal; Notable for the following components:   D-Dimer, Quant 0.66 (*)    All other components within normal limits  LACTIC ACID, PLASMA  TROPONIN I (HIGH SENSITIVITY)    EKG EKG Interpretation  Date/Time:  Saturday October 14 2019 16:32:27 EST Ventricular Rate:  107 PR Interval:    QRS Duration: 95 QT Interval:  318 QTC Calculation: 425 R Axis:   81 Text Interpretation: Sinus tachycardia Borderline T abnormalities, inferior leads No previous ECGs available Confirmed by Frederick Peers [phone]) on 10/14/2019 7:17:32 PM   Radiology Ct Angio Chest Pe W And/or Wo Contrast  Result Date: 10/14/2019 CLINICAL DATA:  Cough, fever and body aches. Recent positive COVID-19 test. EXAM: CT ANGIOGRAPHY CHEST WITH CONTRAST TECHNIQUE: Multidetector CT imaging of the  chest was performed using the standard protocol during bolus administration of intravenous contrast. Multiplanar CT image reconstructions and MIPs were obtained to evaluate the vascular anatomy. CONTRAST:  99mL OMNIPAQUE IOHEXOL 350 MG/ML SOLN COMPARISON:  Chest x-ray, same date. FINDINGS: Cardiovascular: The heart is normal in size. No pericardial effusion. Normal thoracic aorta. No aneurysm or dissection. The pulmonary arterial tree is fairly well opacified. No filling defects to suggest pulmonary embolism. Mediastinum/Nodes: Scattered borderline mediastinal and hilar lymph nodes likely inflammatory/hyperplastic. The esophagus is grossly normal. Lungs/Pleura: Patchy, predominantly peripheral, bilateral ground-glass type infiltrates consistent with atypical viral pneumonia such as COVID-19. No pleural effusion or worrisome pulmonary lesions. Upper Abdomen: No significant upper abdominal findings. Musculoskeletal: No significant findings. Review of the MIP images confirms the above findings. IMPRESSION: 1. No CT findings for pulmonary embolism. 2. Normal thoracic aorta. 3. Numerous patchy largely peripheral bilateral ground-glass infiltrates consistent with atypical pneumonia such as COVID-19. Electronically Signed   By: Marijo Sanes M.D.   On: 10/14/2019 18:21   Dg Chest Portable 1 View  Result Date: 10/14/2019 CLINICAL DATA:  Positive COVID test last Saturday, onset of  symptoms of cough, fever, body aches and chills on Friday before with worsening of symptoms over this past week EXAM: PORTABLE CHEST 1 VIEW COMPARISON:  Portable exam 1637 hours without priors for comparison FINDINGS: Normal heart size, mediastinal contours, and pulmonary vascularity. Question mild retrocardiac LEFT lower lobe infiltrate. Remaining lungs clear. No pleural effusion or pneumothorax. Osseous structures unremarkable. IMPRESSION: Questionable retrocardiac LEFT lower lobe infiltrate. Electronically Signed   By: Lavonia Dana M.D.   On: 10/14/2019 17:20   Vas [place] Lower Extremity Venous (dvt) (mc And Wl 7a-7p)  Result Date: 10/14/2019  Lower Venous Study Indications: Edema, and Covid positive.  Comparison Study: No prior study on file Performing Technologist: Sharion Dove RVS  Examination Guidelines: A complete evaluation includes B-mode imaging, spectral Doppler, color Doppler, and power Doppler as needed of all accessible portions of each vessel. Bilateral testing is considered an integral part of a complete examination. Limited examinations for reoccurring indications may be performed as noted.  +---------+---------------+---------+-----------+----------+--------------+  RIGHT     Compressibility Phasicity Spontaneity Properties Thrombus Aging  +---------+---------------+---------+-----------+----------+--------------+  CFV       Full            Yes       Yes                                    +---------+---------------+---------+-----------+----------+--------------+  SFJ       Full                                                             +---------+---------------+---------+-----------+----------+--------------+  FV Prox   Full                                                             +---------+---------------+---------+-----------+----------+--------------+  FV Mid    Full                                                              +---------+---------------+---------+-----------+----------+--------------+  FV Distal Full                                                             +---------+---------------+---------+-----------+----------+--------------+  PFV       Full                                                             +---------+---------------+---------+-----------+----------+--------------+  POP       Full            Yes       Yes                                    +---------+---------------+---------+-----------+----------+--------------+  PTV       Full                                                             +---------+---------------+---------+-----------+----------+--------------+  PERO      Full                                                             +---------+---------------+---------+-----------+----------+--------------+   +---------+---------------+---------+-----------+----------+--------------+  LEFT      Compressibility Phasicity Spontaneity Properties Thrombus Aging  +---------+---------------+---------+-----------+----------+--------------+  CFV       Full            Yes       Yes                                    +---------+---------------+---------+-----------+----------+--------------+  SFJ       Full                                                             +---------+---------------+---------+-----------+----------+--------------+  FV Prox   Full                                                             +---------+---------------+---------+-----------+----------+--------------+  FV Mid    Full                                                             +---------+---------------+---------+-----------+----------+--------------+  FV Distal Full                                                             +---------+---------------+---------+-----------+----------+--------------+  PFV       Full                                                              +---------+---------------+---------+-----------+----------+--------------+  POP       Full            Yes       Yes                                    +---------+---------------+---------+-----------+----------+--------------+  PTV       Full                                                             +---------+---------------+---------+-----------+----------+--------------+  PERO      Full                                                             +---------+---------------+---------+-----------+----------+--------------+     Summary: Right: There is no evidence of deep vein thrombosis in the lower extremity. Left: There is no evidence of deep vein thrombosis in the lower extremity.  *See table(s) above for measurements and observations.    Preliminary     Procedures Procedures (including critical care time)  Medications Ordered in ED Medications  sodium chloride flush (NS) 0.9 % injection 3 mL (has no administration in time range)  albuterol (VENTOLIN HFA) 108 (90 Base) MCG/ACT inhaler 1 puff (has no administration in time range)  acetaminophen (TYLENOL) tablet 1,000 mg (1,000 mg Oral Given 10/14/19 1621)  sodium chloride 0.9 % bolus 500 mL (500 mLs Intravenous New Bag/Given 10/14/19 1748)  iohexol (OMNIPAQUE) 350 MG/ML injection 100 mL (61 mLs Intravenous Contrast Given 10/14/19 1756)     Initial Impression / Assessment and Plan / ED Course  I have reviewed the triage vital signs and the nursing notes.  Pertinent labs & imaging results that were available during my care of the patient were reviewed by me and considered in my medical decision making (see chart for details).        Walter Roberson was evaluated in Emergency Department on 10/14/2019 for the symptoms described in the history of present illness. He was evaluated in the context of the global COVID-19 pandemic, which necessitated consideration that the patient might be at risk for infection with the SARS-CoV-2 virus that causes  COVID-19. Institutional protocols and algorithms that pertain to the evaluation of patients at risk for COVID-19  are in a state of rapid change based on information released by regulatory bodies including the CDC and federal and state organizations. These policies and algorithms were followed during the patient's care in the ED.  Patient with known Covid presents for worsening symptoms.  He is febrile in the ED, initially tachycardic but with improvement after administration of antipyretics.  He is nonseptic in appearance.  He is complaining of chest pain but reports it is mostly related to the cough and position changes suggesting likely musculoskeletal etiology.  His EKG shows no ST segment abnormality or arrhythmia and his symptoms do not sound cardiac in etiology.  His chest x-ray shows left lower lobe infiltrate so could be in the setting of pleurisy as well.  Remainder of lab work reviewed by me shows no leukocytosis, no anemia, no metabolic derangements, no renal insufficiency.  His lactic acid is mildly elevated, will give fluid bolus.  He is likely dehydrated due to his persistent fever and decreased oral intake.  Due to recent travel and complaint of bilateral calf pain, D-dimer was obtained which was elevated.  Bilateral DVT studies were negative.  CTA shows no evidence of PE, pericardial effusion, or esophageal rupture.  It does show numerous patchy largely perihilar bilateral groundglass infiltrates consistent with COVID-19 pneumonia.  Abdomen soft and nontender.  Doubt acute surgical abdominal pathology.  Doubt dissection, cardiac tamponade, or pneumothorax.  The patient was ambulated in the ED with stable SPO2 saturations.  On reevaluation he is resting comfortably, vital signs have improved.  He ordered a pulse oximeter several days ago which he states should be delivered today.  Recommend continued quarantine at home, symptomatic management, close follow-up with PCP and discussed strict ED return  precautions. Patient verbalized understanding of and agreement with plan and is safe for discharge home at this time.   Final Clinical Impressions(s) / ED Diagnoses   Final diagnoses:  COVID-19  Cough  Myalgia    ED Discharge Orders         Ordered    benzonatate (TESSALON) 100 MG capsule  3 times daily PRN     10/14/19 1858           Jeanie SewerFawze, Vamsi Apfel A, PA-C 10/14/19 1931    Little, Ambrose Finlandachel Morgan, MD 10/15/19 1101

## 2019-10-14 NOTE — ED Triage Notes (Signed)
Pt reports positive covid test last Saturday, started having symptoms of cough, fever, body aches, chills the Friday before his test, but reports symptoms have worsened over the past week, has been taking tylenol every 6 hours. Resp e/u, nad.

## 2019-10-14 NOTE — Progress Notes (Signed)
VASCULAR LAB PRELIMINARY  PRELIMINARY  PRELIMINARY  PRELIMINARY  Bilateral lower extremity venous duplex completed.    Preliminary report:  See CV proc for preliminary results.   Ethleen Lormand, RVT 10/14/2019, 5:47 PM

## 2019-10-14 NOTE — Discharge Instructions (Addendum)
You can take 1 to 2 tablets of Tylenol (350mg -1000mg  depending on the dose) every 6 hours as needed for pain/fever.  Do not exceed 4000 mg of Tylenol daily.  If your pain/fever persists you can take a dose of ibuprofen in between doses of Tylenol.  I usually recommend 400 to 600 mg of ibuprofen every 6 hours.  Take this with food to avoid upset stomach issues.  Start taking Tessalon as needed for cough.  You can also use the albuterol inhaler 1 to 2 puffs every 4-6 hours as needed for shortness of breath or persistent cough.  Drink plenty of fluids and get plenty of rest.  You can use over-the-counter medications such as NyQuil, DayQuil, Delsym, Mucinex, etc. as needed for your symptoms.  Use your pulse oximeter to monitor your oxygen levels.  Seek immediate evaluation if your pulse oximeter reads below 90% at any time.  Continue to quarantine at home.  Follow-up with primary care provider for reevaluation of symptoms.  Return to the emergency department immediately if any concerning signs or symptoms develop such as low oxygen levels on pulse oximeter, persistent vomiting, severe chest pain or shortness of breath, or loss of consciousness.

## 2019-10-14 NOTE — ED Notes (Signed)
[place], [place] notified of Lactic Acid 2.1

## 2019-10-25 ENCOUNTER — Encounter: Payer: Self-pay | Admitting: Internal Medicine

## 2019-10-27 ENCOUNTER — Other Ambulatory Visit: Payer: Self-pay | Admitting: Internal Medicine

## 2019-10-27 DIAGNOSIS — I10 Essential (primary) hypertension: Secondary | ICD-10-CM

## 2019-11-06 ENCOUNTER — Other Ambulatory Visit: Payer: Self-pay | Admitting: Internal Medicine

## 2019-11-06 DIAGNOSIS — F900 Attention-deficit hyperactivity disorder, predominantly inattentive type: Secondary | ICD-10-CM

## 2019-11-07 NOTE — Telephone Encounter (Signed)
Spoke with pharmacist and there is a Rx on file that will be filled today.  Can you please refuse this prescription because it's called in.

## 2019-11-15 ENCOUNTER — Encounter: Payer: Self-pay | Admitting: Internal Medicine

## 2019-11-15 ENCOUNTER — Ambulatory Visit: Payer: BC Managed Care – PPO | Admitting: Internal Medicine

## 2019-11-15 ENCOUNTER — Other Ambulatory Visit: Payer: Self-pay

## 2019-11-15 VITALS — BP 130/100 | HR 103 | Temp 97.6°F | Wt 192.0 lb

## 2019-11-15 DIAGNOSIS — E663 Overweight: Secondary | ICD-10-CM

## 2019-11-15 DIAGNOSIS — Z23 Encounter for immunization: Secondary | ICD-10-CM | POA: Diagnosis not present

## 2019-11-15 DIAGNOSIS — F900 Attention-deficit hyperactivity disorder, predominantly inattentive type: Secondary | ICD-10-CM

## 2019-11-15 DIAGNOSIS — I10 Essential (primary) hypertension: Secondary | ICD-10-CM

## 2019-11-15 LAB — BASIC METABOLIC PANEL
BUN: 10 mg/dL (ref 6–23)
CO2: 30 mEq/L (ref 19–32)
Calcium: 9.7 mg/dL (ref 8.4–10.5)
Chloride: 101 mEq/L (ref 96–112)
Creatinine, Ser: 1.1 mg/dL (ref 0.40–1.50)
GFR: 76.41 mL/min (ref >60.00)
Glucose, Bld: 116 mg/dL — ABNORMAL HIGH (ref 70–99)
Potassium: 3.9 mEq/L (ref 3.5–5.1)
Sodium: 140 mEq/L (ref 135–145)

## 2019-11-15 NOTE — Progress Notes (Signed)
Established Patient Office Visit     This visit occurred during the SARS-CoV-2 public health emergency.  Safety protocols were in place, including screening questions prior to the visit, additional usage of staff PPE, and extensive cleaning of exam room while observing appropriate contact time as indicated for disinfecting solutions.    CC/Reason for Visit: 29-month follow-up  HPI: Walter Roberson is a 34 y.o. male who is coming in today for the above mentioned reasons. Past Medical History is significant for: ADHD on 30 mg of Adderall daily, due for refills in the next 2 weeks, history of hypertension recently started on hydrochlorothiazide 3 months ago.  Since I last saw him he did have COVID-19 infection in November after Thanksgiving, he had hypoxemia and shortness of breath visited the emergency department but was discharged home.  He has since recovered completely and has been doing well.  Has no acute complaints today.   Past Medical/Surgical History: Past Medical History:  Diagnosis Date  . ADHD   . HTN (hypertension)   . Overweight (BMI 25.0-29.9)     Past Surgical History:  Procedure Laterality Date  . CARPAL TUNNEL RELEASE Right   . FINGER CLOSED REDUCTION    . KNEE ARTHROSCOPY WITH OSTEOCHONDRAL DEFECT REPAIR Left 03/01/2019   Procedure: LEFT KNEE ARTHROSCOPY WITH OSTEOCHARAL DEFECT FIXATION MICROFRACTURE;  Surgeon: Dannielle Huh, MD;  Location: [facility];  Service: Orthopedics;  Laterality: Left;  . ORIF ANKLE FRACTURE BIMALLEOLAR Left    x2  . REPAIR ANKLE LIGAMENT Left     Social History:  reports that he has never smoked. He has never used smokeless tobacco. He reports current alcohol use of about 4.0 standard drinks of alcohol per week. He reports that he does not use drugs.  Allergies: No Known Allergies  Family History:  Family History  Problem Relation Age of Onset  . Hypertension Father   . Diabetes Brother      Current  Outpatient Medications:  .  acetaminophen (TYLENOL) 325 MG tablet, Take 650 mg by mouth every 6 (six) hours as needed for mild pain, fever or headache., Disp: , Rfl:  .  amphetamine-dextroamphetamine (ADDERALL XR) 30 MG 24 hr capsule, Take 1 capsule (30 mg total) by mouth every morning., Disp: 30 capsule, Rfl: 0 .  hydrochlorothiazide (HYDRODIURIL) 25 MG tablet, TAKE 1 TABLET BY MOUTH EVERY DAY, Disp: 90 tablet, Rfl: 1 .  meloxicam (MOBIC) 15 MG tablet, Take 15 mg by mouth daily., Disp: , Rfl:   Review of Systems:  Constitutional: Denies fever, chills, diaphoresis, appetite change and fatigue.  HEENT: Denies photophobia, eye pain, redness, hearing loss, ear pain, congestion, sore throat, rhinorrhea, sneezing, mouth sores, trouble swallowing, neck pain, neck stiffness and tinnitus.   Respiratory: Denies SOB, DOE, cough, chest tightness,  and wheezing.   Cardiovascular: Denies chest pain, palpitations and leg swelling.  Gastrointestinal: Denies nausea, vomiting, abdominal pain, diarrhea, constipation, blood in stool and abdominal distention.  Genitourinary: Denies dysuria, urgency, frequency, hematuria, flank pain and difficulty urinating.  Endocrine: Denies: hot or cold intolerance, sweats, changes in hair or nails, polyuria, polydipsia. Musculoskeletal: Denies myalgias, back pain, joint swelling, arthralgias and gait problem.  Skin: Denies pallor, rash and wound.  Neurological: Denies dizziness, seizures, syncope, weakness, light-headedness, numbness and headaches.  Hematological: Denies adenopathy. Easy bruising, personal or family bleeding history  Psychiatric/Behavioral: Denies suicidal ideation, mood changes, confusion, nervousness, sleep disturbance and agitation    Physical Exam: Vitals:   11/15/19 0738  BP: Marland Kitchen)  130/100  Pulse: (!) 103  Temp: 97.6 F (36.4 C)  TempSrc: Temporal  SpO2: 96%  Weight: 192 lb (87.1 kg)    Body mass index is 29.19 kg/m.   Constitutional: NAD,  calm, comfortable Eyes: PERRL, lids and conjunctivae normal ENMT: Mucous membranes are moist. Respiratory: clear to auscultation bilaterally, no wheezing, no crackles. Normal respiratory effort. No accessory muscle use.  Cardiovascular: Regular rate and rhythm, no murmurs / rubs / gallops. No extremity edema.  Neurologic: CN 2-12 grossly intact. Sensation intact, DTR normal. Strength 5/5 in all 4.  Psychiatric: Normal judgment and insight. Alert and oriented x 3. Normal mood.    Impression and Plan:  Essential hypertension  -Blood pressure remains elevated although improved from last visit. -Continue hydrochlorothiazide 25 mg, have asked for home ambulatory blood pressure measurements and follow-up appointment in 8 weeks. -Check basic metabolic profile today to follow renal function and electrolytes on diuretic therapy.  Attention deficit hyperactivity disorder (ADHD), predominantly inattentive type -PDMP reviewed in epic, overdose risk score is 280. -Not due for refills yet, will make note that he is due for refills in 2 weeks.  Overweight (BMI 25.0-29.9) -Discussed healthy lifestyle, including increased physical activity and better food choices to promote weight loss.     Patient Instructions  -Nice seeing you today!!  -Lab work today; will notify you once results are available.  -Flu vaccine today.  -Check blood pressure at home 2-3 times a week and bring those numbers into your next visit.  -Schedule follow up for blood pressure in 8 weeks.     Lelon Frohlich, MD [facility] at [facility]

## 2019-11-15 NOTE — Addendum Note (Signed)
Addended by: Philemon Kingdom on: 11/15/2019 08:21 AM   Modules accepted: Orders

## 2019-11-15 NOTE — Patient Instructions (Signed)
-  Nice seeing you today!!  -Lab work today; will notify you once results are available.  -Flu vaccine today.  -Check blood pressure at home 2-3 times a week and bring those numbers into your next visit.  -Schedule follow up for blood pressure in 8 weeks.

## 2019-12-04 ENCOUNTER — Other Ambulatory Visit: Payer: Self-pay | Admitting: Internal Medicine

## 2019-12-04 DIAGNOSIS — F900 Attention-deficit hyperactivity disorder, predominantly inattentive type: Secondary | ICD-10-CM

## 2019-12-05 ENCOUNTER — Other Ambulatory Visit: Payer: Self-pay | Admitting: Internal Medicine

## 2019-12-05 DIAGNOSIS — F900 Attention-deficit hyperactivity disorder, predominantly inattentive type: Secondary | ICD-10-CM

## 2019-12-06 ENCOUNTER — Encounter: Payer: Self-pay | Admitting: Internal Medicine

## 2019-12-06 NOTE — Telephone Encounter (Signed)
Please deny. 

## 2020-01-03 ENCOUNTER — Other Ambulatory Visit: Payer: Self-pay | Admitting: Internal Medicine

## 2020-01-03 DIAGNOSIS — F900 Attention-deficit hyperactivity disorder, predominantly inattentive type: Secondary | ICD-10-CM

## 2020-01-03 NOTE — Telephone Encounter (Signed)
Forwarding to PCP's CMA  

## 2020-01-04 MED ORDER — AMPHETAMINE-DEXTROAMPHET ER 30 MG PO CP24: 30.0000 mg | ORAL_CAPSULE | ORAL | 0 refills | Status: DC

## 2020-01-04 NOTE — Telephone Encounter (Signed)
Spoke with pharmacist and there are no current prescriptions for Adderall.

## 2020-01-08 DIAGNOSIS — M1712 Unilateral primary osteoarthritis, left knee: Secondary | ICD-10-CM | POA: Diagnosis not present

## 2020-02-01 ENCOUNTER — Encounter: Payer: Self-pay | Admitting: Internal Medicine

## 2020-03-04 ENCOUNTER — Encounter: Payer: Self-pay | Admitting: Internal Medicine

## 2020-03-05 ENCOUNTER — Encounter: Payer: Self-pay | Admitting: *Deleted

## 2020-03-05 ENCOUNTER — Telehealth: Payer: Self-pay | Admitting: Internal Medicine

## 2020-03-05 DIAGNOSIS — F900 Attention-deficit hyperactivity disorder, predominantly inattentive type: Secondary | ICD-10-CM

## 2020-03-05 NOTE — Telephone Encounter (Signed)
Adderrall wasn't sent to the pharmacy as stated it was in a MyChart message this morning.    [facility]

## 2020-03-06 MED ORDER — AMPHETAMINE-DEXTROAMPHET ER 30 MG PO CP24: 30.0000 mg | ORAL_CAPSULE | ORAL | 0 refills | Status: DC

## 2020-03-07 ENCOUNTER — Telehealth: Payer: BC Managed Care – PPO | Admitting: Internal Medicine

## 2020-03-12 ENCOUNTER — Telehealth (INDEPENDENT_AMBULATORY_CARE_PROVIDER_SITE_OTHER): Payer: BC Managed Care – PPO | Admitting: Internal Medicine

## 2020-03-12 DIAGNOSIS — F900 Attention-deficit hyperactivity disorder, predominantly inattentive type: Secondary | ICD-10-CM

## 2020-03-12 DIAGNOSIS — I10 Essential (primary) hypertension: Secondary | ICD-10-CM

## 2020-03-12 MED ORDER — AMPHETAMINE-DEXTROAMPHET ER 30 MG PO CP24: 30.0000 mg | ORAL_CAPSULE | ORAL | 0 refills | Status: DC

## 2020-03-12 NOTE — Progress Notes (Signed)
Virtual Visit via Video Note  I connected with Walter Roberson on 03/12/20 at  3:45 PM EDT by a video enabled telemedicine application and verified that I am speaking with the correct person using two identifiers.  Location patient: home Location provider: work office Persons participating in the virtual visit: patient, provider  I discussed the limitations of evaluation and management by telemedicine and the availability of in person appointments. The patient expressed understanding and agreed to proceed.   HPI: This is a scheduled visit for medication refills for his ADHD He is on Adderall 30 mg daily. Has been on this for some time, no untoward side effects. He also has a h/o HTN. Has not checked BP recently but has been eating healthy and working out.   ROS: Constitutional: Denies fever, chills, diaphoresis, appetite change and fatigue.  HEENT: Denies photophobia, eye pain, redness, hearing loss, ear pain, congestion, sore throat, rhinorrhea, sneezing, mouth sores, trouble swallowing, neck pain, neck stiffness and tinnitus.   Respiratory: Denies SOB, DOE, cough, chest tightness,  and wheezing.   Cardiovascular: Denies chest pain, palpitations and leg swelling.  Gastrointestinal: Denies nausea, vomiting, abdominal pain, diarrhea, constipation, blood in stool and abdominal distention.  Genitourinary: Denies dysuria, urgency, frequency, hematuria, flank pain and difficulty urinating.  Endocrine: Denies: hot or cold intolerance, sweats, changes in hair or nails, polyuria, polydipsia. Musculoskeletal: Denies myalgias, back pain, joint swelling, arthralgias and gait problem.  Skin: Denies pallor, rash and wound.  Neurological: Denies dizziness, seizures, syncope, weakness, light-headedness, numbness and headaches.  Hematological: Denies adenopathy. Easy bruising, personal or family bleeding history  Psychiatric/Behavioral: Denies suicidal ideation, mood changes, confusion,  nervousness, sleep disturbance and agitation   Past Medical History:  Diagnosis Date  . ADHD   . HTN (hypertension)   . Overweight (BMI 25.0-29.9)     Past Surgical History:  Procedure Laterality Date  . CARPAL TUNNEL RELEASE Right   . FINGER CLOSED REDUCTION    . KNEE ARTHROSCOPY WITH OSTEOCHONDRAL DEFECT REPAIR Left 03/01/2019   Procedure: LEFT KNEE ARTHROSCOPY WITH OSTEOCHARAL DEFECT FIXATION MICROFRACTURE;  Surgeon: Dannielle Huh, MD;  Location: [facility];  Service: Orthopedics;  Laterality: Left;  . ORIF ANKLE FRACTURE BIMALLEOLAR Left    x2  . REPAIR ANKLE LIGAMENT Left     Family History  Problem Relation Age of Onset  . Hypertension Father   . Diabetes Brother     SOCIAL HX:   reports that he has never smoked. He has never used smokeless tobacco. He reports current alcohol use of about 4.0 standard drinks of alcohol per week. He reports that he does not use drugs.   Current Outpatient Medications:  .  acetaminophen (TYLENOL) 325 MG tablet, Take 650 mg by mouth every 6 (six) hours as needed for mild pain, fever or headache., Disp: , Rfl:  .  amphetamine-dextroamphetamine (ADDERALL XR) 30 MG 24 hr capsule, Take 1 capsule (30 mg total) by mouth every morning., Disp: 30 capsule, Rfl: 0 .  amphetamine-dextroamphetamine (ADDERALL XR) 30 MG 24 hr capsule, Take 1 capsule (30 mg total) by mouth every morning., Disp: 30 capsule, Rfl: 0 .  hydrochlorothiazide (HYDRODIURIL) 25 MG tablet, TAKE 1 TABLET BY MOUTH EVERY DAY, Disp: 90 tablet, Rfl: 1  EXAM:   VITALS per patient if applicable: none reported  GENERAL: alert, oriented, appears well and in no acute distress  HEENT: atraumatic, conjunttiva clear, no obvious abnormalities on inspection of external nose and ears  NECK: normal movements of the  head and neck  LUNGS: on inspection no signs of respiratory distress, breathing rate appears normal, no obvious gross increased work of breathing, gasping or wheezing   CV: no obvious cyanosis  MS: moves all visible extremities without noticeable abnormality  PSYCH/NEURO: pleasant and cooperative, no obvious depression or anxiety, speech and thought processing grossly intact  ASSESSMENT AND PLAN:   Attention deficit hyperactivity disorder (ADHD), predominantly inattentive type -PDMP reviewed, no red flags, ORS 190. -Refill Adderall 30 mg daily x 2 months (he got 1 month ahead of this appointment)  Essential hypertension -Check in office when he returns for f/u visit.     I discussed the assessment and treatment plan with the patient. The patient was provided an opportunity to ask questions and all were answered. The patient agreed with the plan and demonstrated an understanding of the instructions.   The patient was advised to call back or seek an in-person evaluation if the symptoms worsen or if the condition fails to improve as anticipated.    Lelon Frohlich, MD  [facility] at [facility]

## 2020-04-02 ENCOUNTER — Other Ambulatory Visit: Payer: Self-pay | Admitting: Internal Medicine

## 2020-04-02 DIAGNOSIS — F900 Attention-deficit hyperactivity disorder, predominantly inattentive type: Secondary | ICD-10-CM

## 2020-04-02 NOTE — Telephone Encounter (Signed)
Please deny.  I spoke with the pharmacist and this will be filled 04/04/20

## 2020-04-04 ENCOUNTER — Other Ambulatory Visit: Payer: Self-pay | Admitting: Internal Medicine

## 2020-04-04 DIAGNOSIS — F900 Attention-deficit hyperactivity disorder, predominantly inattentive type: Secondary | ICD-10-CM

## 2020-04-04 NOTE — Telephone Encounter (Signed)
Please deny.  Prescription was approved by phone.

## 2020-04-04 NOTE — Telephone Encounter (Signed)
Forwarding to PCP CMA.  

## 2020-04-10 DIAGNOSIS — M1712 Unilateral primary osteoarthritis, left knee: Secondary | ICD-10-CM | POA: Diagnosis not present

## 2020-04-24 ENCOUNTER — Other Ambulatory Visit: Payer: Self-pay | Admitting: *Deleted

## 2020-04-24 DIAGNOSIS — I10 Essential (primary) hypertension: Secondary | ICD-10-CM

## 2020-04-24 MED ORDER — HYDROCHLOROTHIAZIDE 25 MG PO TABS: 25.0000 mg | ORAL_TABLET | Freq: Every day | ORAL | 1 refills | Status: DC

## 2020-05-03 ENCOUNTER — Other Ambulatory Visit: Payer: Self-pay | Admitting: Internal Medicine

## 2020-05-03 DIAGNOSIS — F900 Attention-deficit hyperactivity disorder, predominantly inattentive type: Secondary | ICD-10-CM

## 2020-05-07 NOTE — Telephone Encounter (Signed)
Spoke with pharmacist and patient picked up his prescription 05/06/20.  There are no remaining refills.  Please deny

## 2020-05-07 NOTE — Telephone Encounter (Signed)
Please deny.  Duplicate. 

## 2020-05-28 ENCOUNTER — Telehealth: Payer: BC Managed Care – PPO | Admitting: Internal Medicine

## 2020-05-31 ENCOUNTER — Encounter: Payer: Self-pay | Admitting: Internal Medicine

## 2020-05-31 ENCOUNTER — Other Ambulatory Visit: Payer: Self-pay

## 2020-05-31 ENCOUNTER — Ambulatory Visit: Payer: BC Managed Care – PPO | Admitting: Internal Medicine

## 2020-05-31 VITALS — BP 140/100 | HR 93 | Temp 97.8°F | Wt 197.8 lb

## 2020-05-31 DIAGNOSIS — F900 Attention-deficit hyperactivity disorder, predominantly inattentive type: Secondary | ICD-10-CM | POA: Diagnosis not present

## 2020-05-31 DIAGNOSIS — I10 Essential (primary) hypertension: Secondary | ICD-10-CM | POA: Diagnosis not present

## 2020-05-31 MED ORDER — AMPHETAMINE-DEXTROAMPHET ER 30 MG PO CP24: 30.0000 mg | ORAL_CAPSULE | ORAL | 0 refills | Status: DC

## 2020-05-31 MED ORDER — LISINOPRIL 10 MG PO TABS: 10.0000 mg | ORAL_TABLET | Freq: Every day | ORAL | 1 refills | Status: DC

## 2020-05-31 NOTE — Patient Instructions (Signed)
-  Nice seeing you today!!  -Start lisinopril 10 mg daily.  -Schedule a 6 week follow up for your blood pressure.

## 2020-05-31 NOTE — Progress Notes (Signed)
Established Patient Office Visit     This visit occurred during the SARS-CoV-2 public health emergency.  Safety protocols were in place, including screening questions prior to the visit, additional usage of staff PPE, and extensive cleaning of exam room while observing appropriate contact time as indicated for disinfecting solutions.    CC/Reason for Visit: Blood pressure follow-up, medication refills  HPI: Walter Roberson is a 34 y.o. male who is coming in today for the above mentioned reasons. Past Medical History is significant for: Hypertension that has not been well controlled on hydrochlorothiazide and ADHD for which he takes 30 mg of extended release Adderall daily.  He has no acute complaints today.  Has been doing well since I last saw him.   Past Medical/Surgical History: Past Medical History:  Diagnosis Date  . ADHD   . HTN (hypertension)   . Overweight (BMI 25.0-29.9)     Past Surgical History:  Procedure Laterality Date  . CARPAL TUNNEL RELEASE Right   . FINGER CLOSED REDUCTION    . KNEE ARTHROSCOPY WITH OSTEOCHONDRAL DEFECT REPAIR Left 03/01/2019   Procedure: LEFT KNEE ARTHROSCOPY WITH OSTEOCHARAL DEFECT FIXATION MICROFRACTURE;  Surgeon: Dannielle Huh, MD;  Location: [facility];  Service: Orthopedics;  Laterality: Left;  . ORIF ANKLE FRACTURE BIMALLEOLAR Left    x2  . REPAIR ANKLE LIGAMENT Left     Social History:  reports that he has never smoked. He has never used smokeless tobacco. He reports current alcohol use of about 4.0 standard drinks of alcohol per week. He reports that he does not use drugs.  Allergies: No Known Allergies  Family History:  Family History  Problem Relation Age of Onset  . Hypertension Father   . Diabetes Brother      Current Outpatient Medications:  .  acetaminophen (TYLENOL) 325 MG tablet, Take 650 mg by mouth every 6 (six) hours as needed for mild pain, fever or headache., Disp: , Rfl:  .   amphetamine-dextroamphetamine (ADDERALL XR) 30 MG 24 hr capsule, Take 1 capsule (30 mg total) by mouth every morning., Disp: 30 capsule, Rfl: 0 .  amphetamine-dextroamphetamine (ADDERALL XR) 30 MG 24 hr capsule, Take 1 capsule (30 mg total) by mouth every morning., Disp: 30 capsule, Rfl: 0 .  hydrochlorothiazide (HYDRODIURIL) 25 MG tablet, Take 1 tablet (25 mg total) by mouth daily., Disp: 90 tablet, Rfl: 1 .  amphetamine-dextroamphetamine (ADDERALL XR) 30 MG 24 hr capsule, Take 1 capsule (30 mg total) by mouth every morning., Disp: 30 capsule, Rfl: 0 .  lisinopril (ZESTRIL) 10 MG tablet, Take 1 tablet (10 mg total) by mouth daily., Disp: 90 tablet, Rfl: 1  Review of Systems:  Constitutional: Denies fever, chills, diaphoresis, appetite change and fatigue.  HEENT: Denies photophobia, eye pain, redness, hearing loss, ear pain, congestion, sore throat, rhinorrhea, sneezing, mouth sores, trouble swallowing, neck pain, neck stiffness and tinnitus.   Respiratory: Denies SOB, DOE, cough, chest tightness,  and wheezing.   Cardiovascular: Denies chest pain, palpitations and leg swelling.  Gastrointestinal: Denies nausea, vomiting, abdominal pain, diarrhea, constipation, blood in stool and abdominal distention.  Genitourinary: Denies dysuria, urgency, frequency, hematuria, flank pain and difficulty urinating.  Endocrine: Denies: hot or cold intolerance, sweats, changes in hair or nails, polyuria, polydipsia. Musculoskeletal: Denies myalgias, back pain, joint swelling, arthralgias and gait problem.  Skin: Denies pallor, rash and wound.  Neurological: Denies dizziness, seizures, syncope, weakness, light-headedness, numbness and headaches.  Hematological: Denies adenopathy. Easy bruising, personal or family bleeding  history  Psychiatric/Behavioral: Denies suicidal ideation, mood changes, confusion, nervousness, sleep disturbance and agitation    Physical Exam: Vitals:   05/31/20 0738  BP: (!) 140/100    Pulse: 93  Temp: 97.8 F (36.6 C)  TempSrc: Oral  SpO2: 98%  Weight: 197 lb 12.8 oz (89.7 kg)    Body mass index is 30.08 kg/m.   Constitutional: NAD, calm, comfortable Eyes: PERRL, lids and conjunctivae normal ENMT: Mucous membranes are moist.  Respiratory: clear to auscultation bilaterally, no wheezing, no crackles. Normal respiratory effort. No accessory muscle use.  Cardiovascular: Regular rate and rhythm, no murmurs / rubs / gallops. No extremity edema.  Neurologic: Grossly intact and nonfocal Psychiatric: Normal judgment and insight. Alert and oriented x 3. Normal mood.    Impression and Plan:  Essential hypertension  - blood pressure is not at goal, previous visit in January was also 130/100. -Add lisinopril 10 mg to hydrochlorothiazide 25 mg daily. -He will do ambulatory blood pressure monitoring and return in 6 weeks for follow-up. -Basic metabolic profile when he returns to follow renal function and electrolytes.  Attention deficit hyperactivity disorder (ADHD), predominantly inattentive type -PDMP reviewed, no red flags, overdose risk score is 180. -30 mg of Adderall XR, 30 tablets a month for 3 months prescribed.    Patient Instructions  -Nice seeing you today!!  -Start lisinopril 10 mg daily.  -Schedule a 6 week follow up for your blood pressure.     Chaya Jan, MD [facility] at [facility]

## 2020-07-02 ENCOUNTER — Telehealth: Payer: Self-pay | Admitting: Internal Medicine

## 2020-07-02 DIAGNOSIS — F900 Attention-deficit hyperactivity disorder, predominantly inattentive type: Secondary | ICD-10-CM

## 2020-07-02 NOTE — Telephone Encounter (Signed)
Message routed to PCP CMA  

## 2020-07-03 NOTE — Telephone Encounter (Addendum)
Pt stated he takes his last pill today and would like another PCP to review this since his is out on vacation. Informed pt that I cannot guarantee that another doctor will approve his medication due to it being a controlled substance   Pt can be reached at [phone]

## 2020-07-03 NOTE — Telephone Encounter (Signed)
Please deny.  Spoke with pharmacist and there is a refill on file. °

## 2020-07-03 NOTE — Telephone Encounter (Signed)
I advised the patient to call his pharmacy and he is wanting the nurse to call him back also.

## 2020-07-03 NOTE — Telephone Encounter (Signed)
Please deny.  Spoke with pharmacist and there is a refill on file.

## 2020-07-08 DIAGNOSIS — M1712 Unilateral primary osteoarthritis, left knee: Secondary | ICD-10-CM | POA: Diagnosis not present

## 2020-07-11 NOTE — Telephone Encounter (Signed)
Spoke with pharmacist and patient picked up his prescription 8/25.  Patient has 1 refill on hold remaining.

## 2020-08-01 ENCOUNTER — Other Ambulatory Visit: Payer: Self-pay | Admitting: Internal Medicine

## 2020-08-01 DIAGNOSIS — F900 Attention-deficit hyperactivity disorder, predominantly inattentive type: Secondary | ICD-10-CM

## 2020-08-02 NOTE — Telephone Encounter (Signed)
Please see the below request.

## 2020-08-05 ENCOUNTER — Encounter: Payer: Self-pay | Admitting: Internal Medicine

## 2020-08-05 NOTE — Telephone Encounter (Signed)
Please deny.  Rx  On file

## 2020-08-05 NOTE — Telephone Encounter (Signed)
Rx will be filled today with no refills.

## 2020-08-30 IMAGING — DX DG CHEST 1V PORT
1 series · 1 of 1 positions shown · non-contrast
Comparison: Portable exam 2097 hours without priors for comparison

CLINICAL DATA: Positive COVID test last [REDACTED], onset of symptoms
of cough, fever, body aches and chills on [REDACTED] before with
worsening of symptoms over this past week

EXAM:
PORTABLE CHEST 1 VIEW

[chest ap]
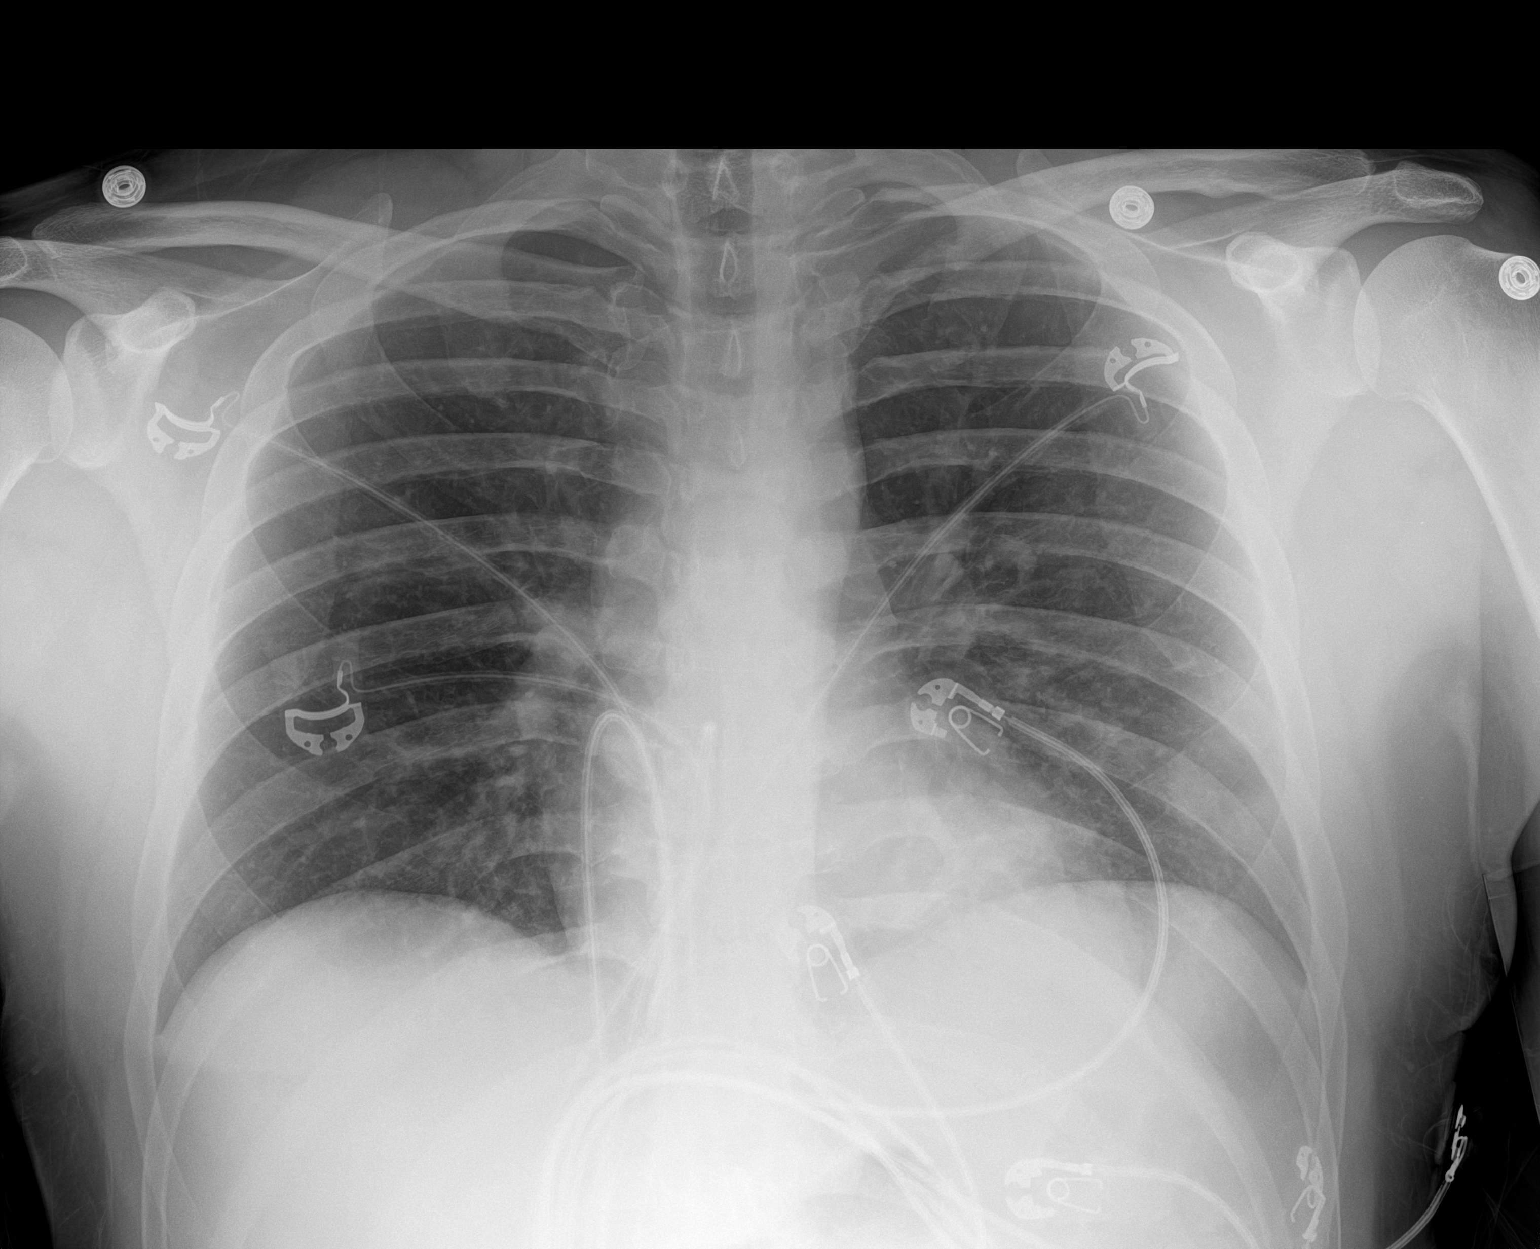

[1 of 1 positions shown; findings below may reference images not displayed]

FINDINGS: Normal heart size, mediastinal contours, and pulmonary vascularity.

Question mild retrocardiac LEFT lower lobe infiltrate.

Remaining lungs clear.

No pleural effusion or pneumothorax.

Osseous structures unremarkable.
IMPRESSION: Questionable retrocardiac LEFT lower lobe infiltrate.

## 2020-08-30 IMAGING — CT CT ANGIO CHEST
2 of 6 series · 18 of 46 positions shown · IV contrast (omnipaque)
Comparison: Chest x-ray, same date.

CLINICAL DATA: Cough, fever and body aches. Recent positive
RCEKP-WT test.

EXAM:
CT ANGIOGRAPHY CHEST WITH CONTRAST
TECHNIQUE: Multidetector CT imaging of the chest was performed using the
standard protocol during bolus administration of intravenous
contrast. Multiplanar CT image reconstructions and MIPs were
obtained to evaluate the vascular anatomy.
CONTRAST:  61mL OMNIPAQUE IOHEXOL 350 MG/ML SOLN

[Series 6: thins · axial · 0.77mm/px · z∈[+1212,+1456]mm · 15 of 268 slices shown]
[im 12/268  lung]
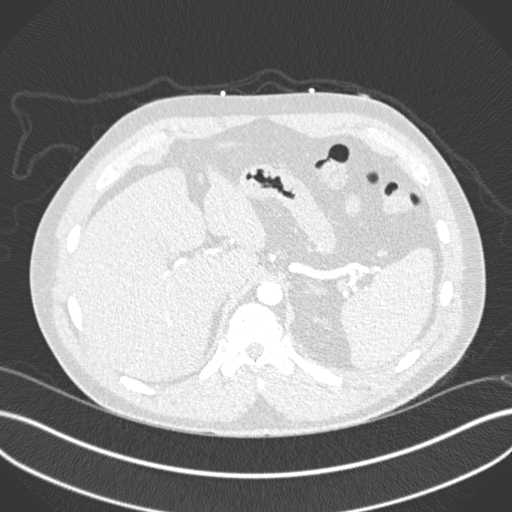
[im 35/268  soft-tissue]
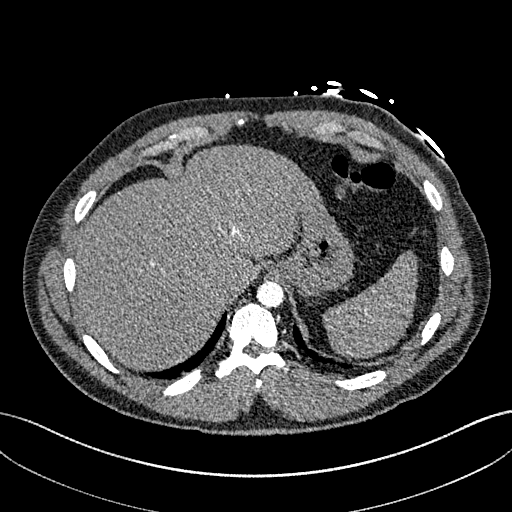
[im 47/268  lung]
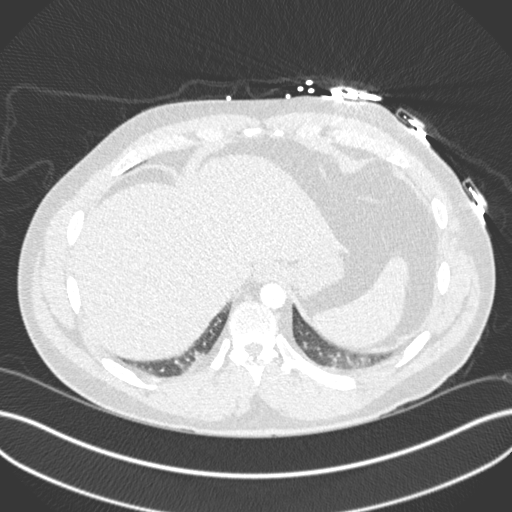
[im 70/268  soft-tissue]
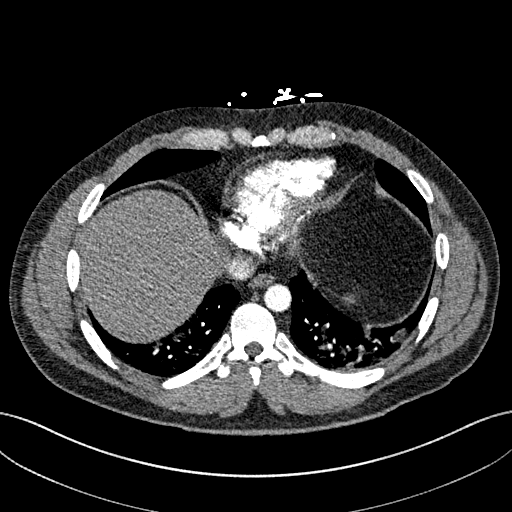
[im 82/268  lung]
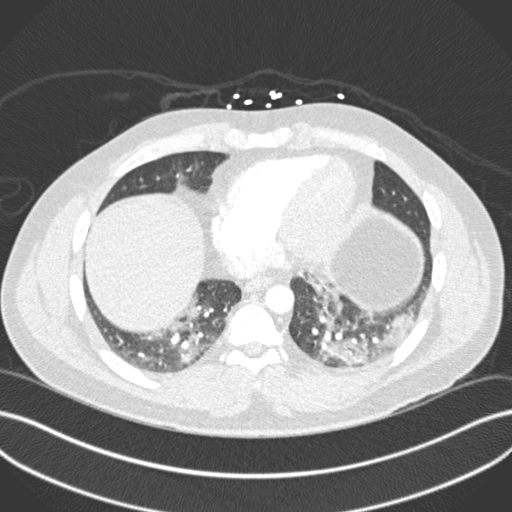
[im 105/268  soft-tissue]
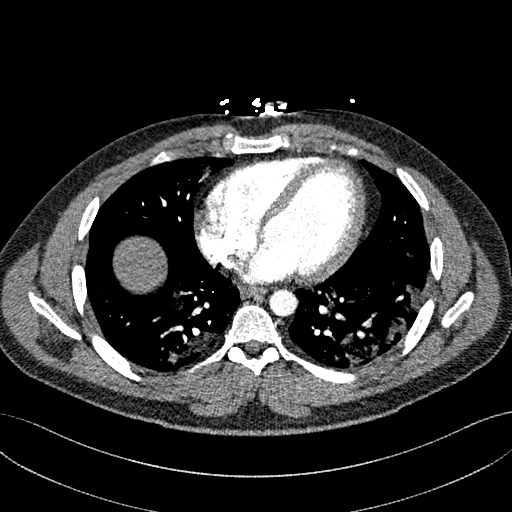
[im 117/268  lung]
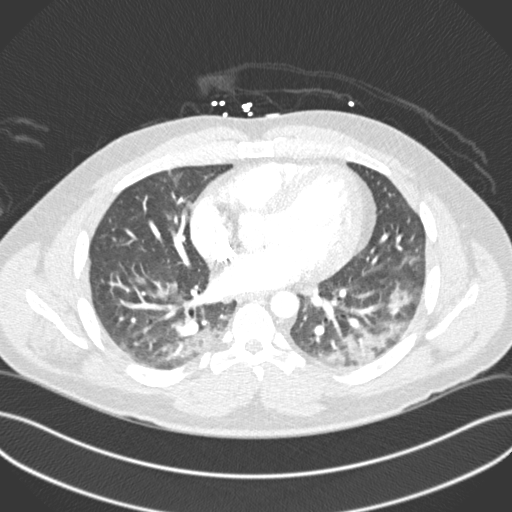
[im 140/268  soft-tissue]
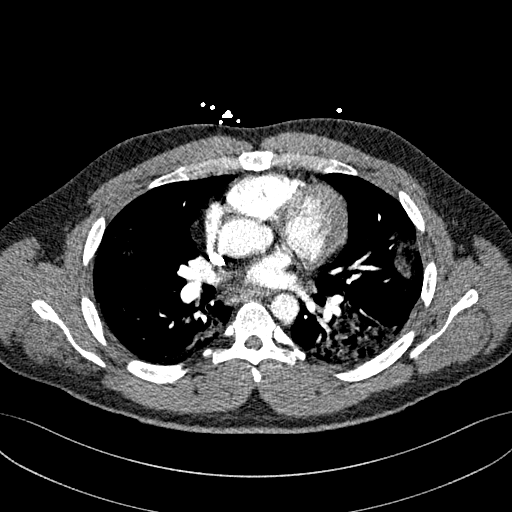
[im 151/268  lung]
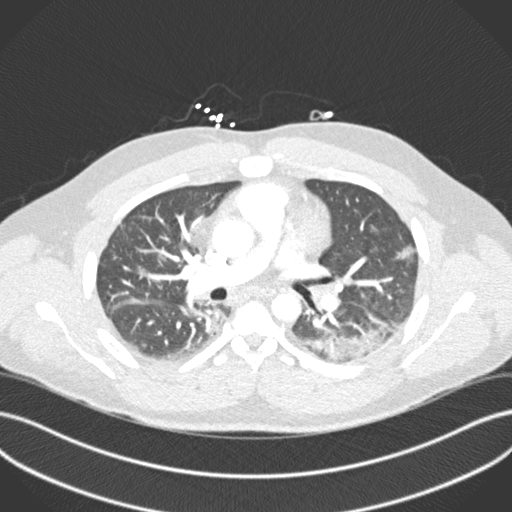
[im 163/268  soft-tissue]
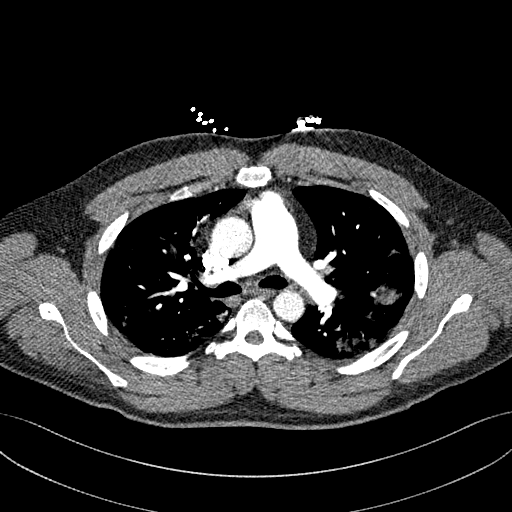
[im 186/268  lung]
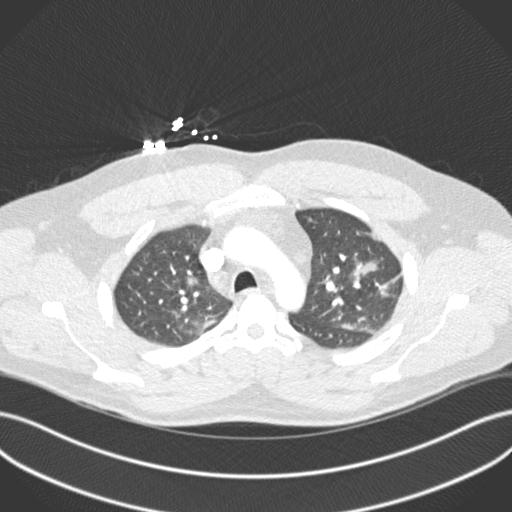
[im 198/268  soft-tissue]
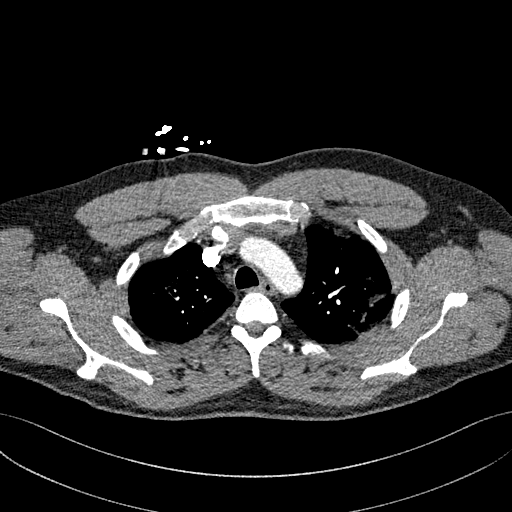
[im 221/268  lung]
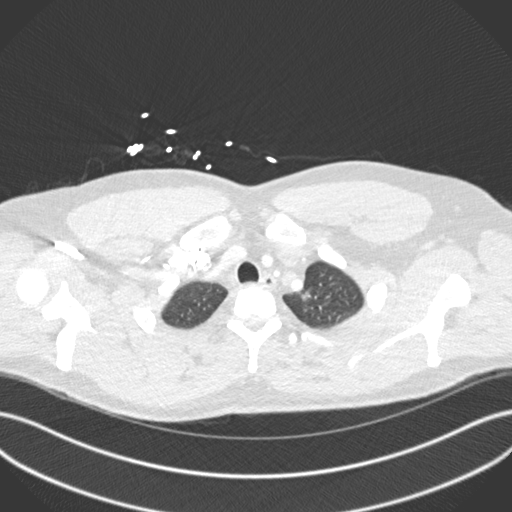
[im 233/268  soft-tissue]
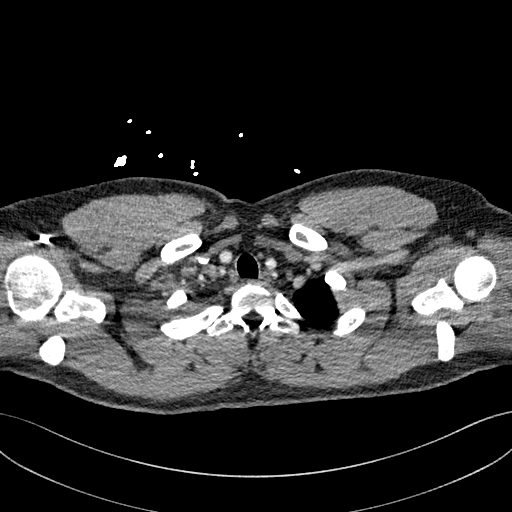
[im 256/268  lung]
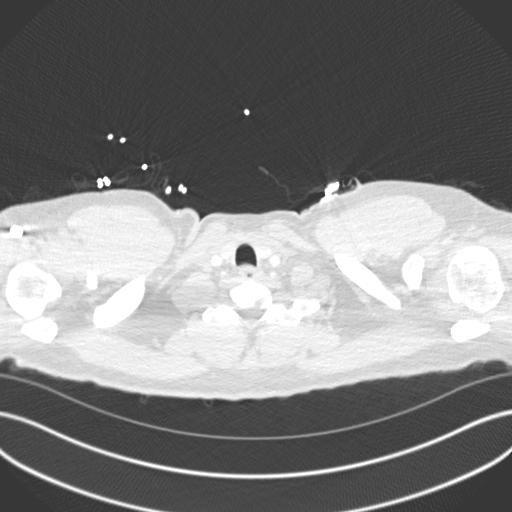

[Series 8: coronal mpr · coronal · 0.57mm/px · 3 of 139 slices shown]
[im 35/139  soft-tissue]
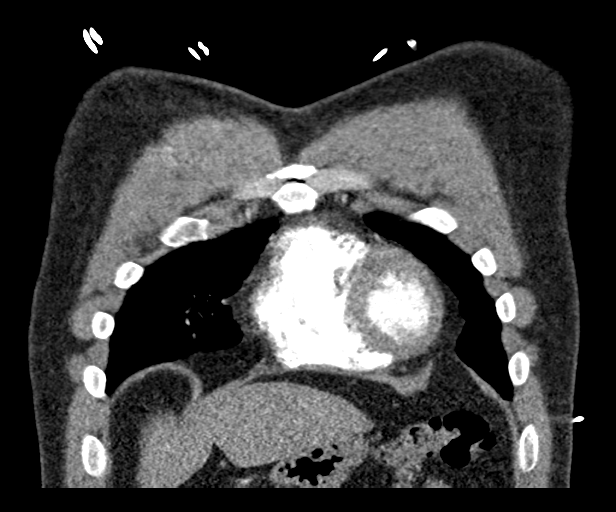
[im 70/139  soft-tissue]
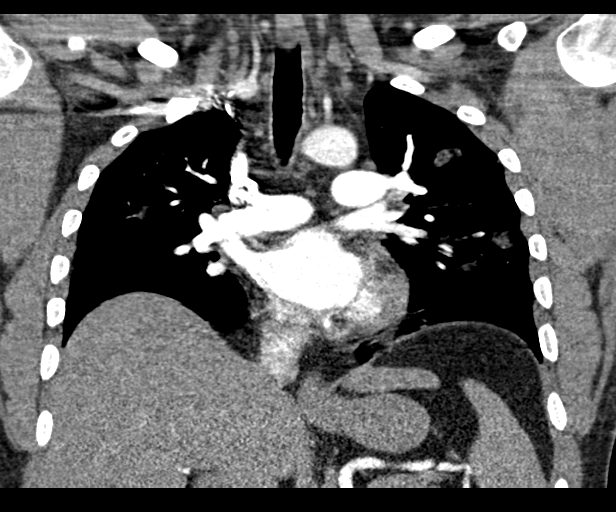
[im 104/139  soft-tissue]
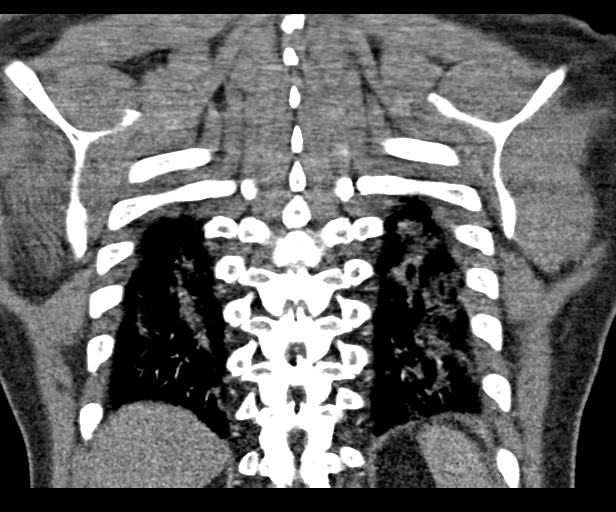

[18 of 46 positions shown; findings below may reference images not displayed]

FINDINGS: Cardiovascular: The heart is normal in size. No pericardial
effusion. Normal thoracic aorta. No aneurysm or dissection.

The pulmonary arterial tree is fairly well opacified. No filling
defects to suggest pulmonary embolism.

Mediastinum/Nodes: Scattered borderline mediastinal and hilar lymph
nodes likely inflammatory/hyperplastic. The esophagus is grossly
normal.

Lungs/Pleura: Patchy, predominantly peripheral, bilateral
ground-glass type infiltrates consistent with atypical viral
pneumonia such as RCEKP-WT. No pleural effusion or worrisome
pulmonary lesions.

Upper Abdomen: No significant upper abdominal findings.

Musculoskeletal: No significant findings.

Review of the MIP images confirms the above findings.
IMPRESSION: 1. No CT findings for pulmonary embolism.
2. Normal thoracic aorta.
3. Numerous patchy largely peripheral bilateral ground-glass
infiltrates consistent with atypical pneumonia such as RCEKP-WT.

## 2020-09-03 ENCOUNTER — Encounter: Payer: Self-pay | Admitting: Internal Medicine

## 2020-09-03 DIAGNOSIS — Z20822 Contact with and (suspected) exposure to covid-19: Secondary | ICD-10-CM | POA: Diagnosis not present

## 2020-09-03 DIAGNOSIS — J029 Acute pharyngitis, unspecified: Secondary | ICD-10-CM | POA: Diagnosis not present

## 2020-09-11 ENCOUNTER — Encounter: Payer: Self-pay | Admitting: Internal Medicine

## 2020-09-11 ENCOUNTER — Telehealth (INDEPENDENT_AMBULATORY_CARE_PROVIDER_SITE_OTHER): Payer: BC Managed Care – PPO | Admitting: Internal Medicine

## 2020-09-11 VITALS — BP 118/75

## 2020-09-11 DIAGNOSIS — F900 Attention-deficit hyperactivity disorder, predominantly inattentive type: Secondary | ICD-10-CM | POA: Diagnosis not present

## 2020-09-11 DIAGNOSIS — I10 Essential (primary) hypertension: Secondary | ICD-10-CM

## 2020-09-11 MED ORDER — AMPHETAMINE-DEXTROAMPHET ER 30 MG PO CP24: 30.0000 mg | ORAL_CAPSULE | ORAL | 0 refills | Status: DC

## 2020-09-11 NOTE — Progress Notes (Signed)
Virtual Visit via Video Note  I connected with Walter Roberson on 09/11/20 at  1:30 PM EDT by a video enabled telemedicine application and verified that I am speaking with the correct person using two identifiers.  Location patient: home Location provider: work office Persons participating in the virtual visit: patient, provider  I discussed the limitations of evaluation and management by telemedicine and the availability of in person appointments. The patient expressed understanding and agreed to proceed.   HPI: He has scheduled this visit mainly for medication refills of his Adderall.  Takes Adderall XR 30 mg daily for his ADHD.  He has been on this dose for years, does well on it with no untoward side effects.  He has been keeping closer check on his blood pressure numbers.  He tells me that on average they are ranging between 1 18-1 25 systolic and 75/82 diastolic.   ROS: Constitutional: Denies fever, chills, diaphoresis, appetite change and fatigue.  HEENT: Denies photophobia, eye pain, redness, hearing loss, ear pain, congestion, sore throat, rhinorrhea, sneezing, mouth sores, trouble swallowing, neck pain, neck stiffness and tinnitus.   Respiratory: Denies SOB, DOE, cough, chest tightness,  and wheezing.   Cardiovascular: Denies chest pain, palpitations and leg swelling.  Gastrointestinal: Denies nausea, vomiting, abdominal pain, diarrhea, constipation, blood in stool and abdominal distention.  Genitourinary: Denies dysuria, urgency, frequency, hematuria, flank pain and difficulty urinating.  Endocrine: Denies: hot or cold intolerance, sweats, changes in hair or nails, polyuria, polydipsia. Musculoskeletal: Denies myalgias, back pain, joint swelling, arthralgias and gait problem.  Skin: Denies pallor, rash and wound.  Neurological: Denies dizziness, seizures, syncope, weakness, light-headedness, numbness and headaches.  Hematological: Denies adenopathy. Easy bruising,  personal or family bleeding history  Psychiatric/Behavioral: Denies suicidal ideation, mood changes, confusion, nervousness, sleep disturbance and agitation   Past Medical History:  Diagnosis Date  . ADHD   . HTN (hypertension)   . Overweight (BMI 25.0-29.9)     Past Surgical History:  Procedure Laterality Date  . CARPAL TUNNEL RELEASE Right   . FINGER CLOSED REDUCTION    . KNEE ARTHROSCOPY WITH OSTEOCHONDRAL DEFECT REPAIR Left 03/01/2019   Procedure: LEFT KNEE ARTHROSCOPY WITH OSTEOCHARAL DEFECT FIXATION MICROFRACTURE;  Surgeon: Dannielle Huh, MD;  Location: [facility];  Service: Orthopedics;  Laterality: Left;  . ORIF ANKLE FRACTURE BIMALLEOLAR Left    x2  . REPAIR ANKLE LIGAMENT Left     Family History  Problem Relation Age of Onset  . Hypertension Father   . Diabetes Brother     SOCIAL HX:   reports that he has never smoked. He has never used smokeless tobacco. He reports current alcohol use of about 4.0 standard drinks of alcohol per week. He reports that he does not use drugs.   Current Outpatient Medications:  .  acetaminophen (TYLENOL) 325 MG tablet, Take 650 mg by mouth every 6 (six) hours as needed for mild pain, fever or headache., Disp: , Rfl:  .  amphetamine-dextroamphetamine (ADDERALL XR) 30 MG 24 hr capsule, Take 1 capsule (30 mg total) by mouth every morning., Disp: 30 capsule, Rfl: 0 .  amphetamine-dextroamphetamine (ADDERALL XR) 30 MG 24 hr capsule, Take 1 capsule (30 mg total) by mouth every morning., Disp: 30 capsule, Rfl: 0 .  amphetamine-dextroamphetamine (ADDERALL XR) 30 MG 24 hr capsule, Take 1 capsule (30 mg total) by mouth every morning., Disp: 30 capsule, Rfl: 0 .  hydrochlorothiazide (HYDRODIURIL) 25 MG tablet, Take 1 tablet (25 mg total) by mouth  daily., Disp: 90 tablet, Rfl: 1 .  lisinopril (ZESTRIL) 10 MG tablet, Take 1 tablet (10 mg total) by mouth daily., Disp: 90 tablet, Rfl: 1  EXAM:   VITALS per patient if applicable: None  reported  GENERAL: alert, oriented, appears well and in no acute distress  HEENT: atraumatic, conjunttiva clear, no obvious abnormalities on inspection of external nose and ears  NECK: normal movements of the head and neck  LUNGS: on inspection no signs of respiratory distress, breathing rate appears normal, no obvious gross increased work of breathing, gasping or wheezing  CV: no obvious cyanosis  MS: moves all visible extremities without noticeable abnormality  PSYCH/NEURO: pleasant and cooperative, no obvious depression or anxiety, speech and thought processing grossly intact  ASSESSMENT AND PLAN:   Primary hypertension -Per his report blood pressure is significantly improved, systolics between 118 and 125 and diastolics between 75 and 82.  Attention deficit hyperactivity disorder (ADHD), predominantly inattentive type -PDMP reviewed, no red flags, overdose risk score is 190. -Refill Adderall XR 30 mg to take 1 tablet a day for 30 tablets a month for 3 months.    I discussed the assessment and treatment plan with the patient. The patient was provided an opportunity to ask questions and all were answered. The patient agreed with the plan and demonstrated an understanding of the instructions.   The patient was advised to call back or seek an in-person evaluation if the symptoms worsen or if the condition fails to improve as anticipated.    Chaya Jan, MD  [facility] at [facility]

## 2020-10-09 ENCOUNTER — Encounter: Payer: Self-pay | Admitting: Internal Medicine

## 2020-10-11 ENCOUNTER — Encounter: Payer: Self-pay | Admitting: Internal Medicine

## 2020-10-11 DIAGNOSIS — Z03818 Encounter for observation for suspected exposure to other biological agents ruled out: Secondary | ICD-10-CM | POA: Diagnosis not present

## 2020-10-25 ENCOUNTER — Other Ambulatory Visit: Payer: Self-pay | Admitting: Internal Medicine

## 2020-10-25 DIAGNOSIS — I10 Essential (primary) hypertension: Secondary | ICD-10-CM

## 2020-11-11 ENCOUNTER — Other Ambulatory Visit: Payer: Self-pay | Admitting: Internal Medicine

## 2020-11-11 DIAGNOSIS — F900 Attention-deficit hyperactivity disorder, predominantly inattentive type: Secondary | ICD-10-CM

## 2020-11-19 NOTE — Telephone Encounter (Signed)
Please deny.  Patient picked up 11/13/20.

## 2020-11-27 ENCOUNTER — Other Ambulatory Visit: Payer: Self-pay | Admitting: Internal Medicine

## 2020-11-27 DIAGNOSIS — I10 Essential (primary) hypertension: Secondary | ICD-10-CM

## 2020-12-12 ENCOUNTER — Other Ambulatory Visit: Payer: Self-pay | Admitting: Internal Medicine

## 2020-12-12 DIAGNOSIS — F900 Attention-deficit hyperactivity disorder, predominantly inattentive type: Secondary | ICD-10-CM

## 2020-12-13 NOTE — Telephone Encounter (Signed)
Unable to deny a controlled substance.   Spoke with pharmacist and the last refill picked up was 11/13/20 and no refills remain. Patient will need an office visit/ VV.  Please deny.

## 2020-12-18 ENCOUNTER — Encounter: Payer: Self-pay | Admitting: Internal Medicine

## 2020-12-18 DIAGNOSIS — F900 Attention-deficit hyperactivity disorder, predominantly inattentive type: Secondary | ICD-10-CM

## 2020-12-18 MED ORDER — AMPHETAMINE-DEXTROAMPHET ER 30 MG PO CP24: 30.0000 mg | ORAL_CAPSULE | ORAL | 0 refills | Status: DC

## 2021-01-01 DIAGNOSIS — M1712 Unilateral primary osteoarthritis, left knee: Secondary | ICD-10-CM | POA: Diagnosis not present

## 2021-01-13 ENCOUNTER — Other Ambulatory Visit: Payer: Self-pay

## 2021-01-14 ENCOUNTER — Encounter: Payer: Self-pay | Admitting: Internal Medicine

## 2021-01-14 ENCOUNTER — Ambulatory Visit: Payer: BC Managed Care – PPO | Admitting: Internal Medicine

## 2021-01-14 VITALS — BP 110/76 | HR 117 | Temp 97.6°F | Wt 207.1 lb

## 2021-01-14 DIAGNOSIS — F900 Attention-deficit hyperactivity disorder, predominantly inattentive type: Secondary | ICD-10-CM | POA: Diagnosis not present

## 2021-01-14 DIAGNOSIS — I10 Essential (primary) hypertension: Secondary | ICD-10-CM | POA: Diagnosis not present

## 2021-01-14 MED ORDER — AMPHETAMINE-DEXTROAMPHET ER 30 MG PO CP24: 30.0000 mg | ORAL_CAPSULE | ORAL | 0 refills | Status: DC

## 2021-01-14 NOTE — Patient Instructions (Signed)
-  Nice seeing you today!!  -Schedule follow up for your physical in 3 months. Please come in fasting.

## 2021-01-14 NOTE — Progress Notes (Signed)
Established Patient Office Visit     This visit occurred during the SARS-CoV-2 public health emergency.  Safety protocols were in place, including screening questions prior to the visit, additional usage of staff PPE, and extensive cleaning of exam room while observing appropriate contact time as indicated for disinfecting solutions.    CC/Reason for Visit: Follow-up chronic medical conditions  HPI: Walter Roberson is a 35 y.o. male who is coming in today for the above mentioned reasons. Past Medical History is significant for: ADHD on Adderall and hypertension.  His blood pressure has been well controlled.  He is due for Adderall refills.  He takes XR 30 mg daily.  Dose is working well for him.  He has no acute issues today he is overdue for his physical.   Past Medical/Surgical History: Past Medical History:  Diagnosis Date  . ADHD   . HTN (hypertension)   . Overweight (BMI 25.0-29.9)     Past Surgical History:  Procedure Laterality Date  . CARPAL TUNNEL RELEASE Right   . FINGER CLOSED REDUCTION    . KNEE ARTHROSCOPY WITH OSTEOCHONDRAL DEFECT REPAIR Left 03/01/2019   Procedure: LEFT KNEE ARTHROSCOPY WITH OSTEOCHARAL DEFECT FIXATION MICROFRACTURE;  Surgeon: Dannielle Huh, MD;  Location: [facility];  Service: Orthopedics;  Laterality: Left;  . ORIF ANKLE FRACTURE BIMALLEOLAR Left    x2  . REPAIR ANKLE LIGAMENT Left     Social History:  reports that he has never smoked. He has never used smokeless tobacco. He reports current alcohol use of about 4.0 standard drinks of alcohol per week. He reports that he does not use drugs.  Allergies: No Known Allergies  Family History:  Family History  Problem Relation Age of Onset  . Hypertension Father   . Diabetes Brother      Current Outpatient Medications:  .  acetaminophen (TYLENOL) 325 MG tablet, Take 650 mg by mouth every 6 (six) hours as needed for mild pain, fever or headache., Disp: , Rfl:  .   hydrochlorothiazide (HYDRODIURIL) 25 MG tablet, TAKE 1 TABLET BY MOUTH EVERY DAY, Disp: 90 tablet, Rfl: 1 .  lisinopril (ZESTRIL) 10 MG tablet, TAKE 1 TABLET BY MOUTH EVERY DAY, Disp: 90 tablet, Rfl: 1 .  amphetamine-dextroamphetamine (ADDERALL XR) 30 MG 24 hr capsule, Take 1 capsule (30 mg total) by mouth every morning., Disp: 30 capsule, Rfl: 0 .  amphetamine-dextroamphetamine (ADDERALL XR) 30 MG 24 hr capsule, Take 1 capsule (30 mg total) by mouth every morning., Disp: 30 capsule, Rfl: 0 .  amphetamine-dextroamphetamine (ADDERALL XR) 30 MG 24 hr capsule, Take 1 capsule (30 mg total) by mouth every morning., Disp: 30 capsule, Rfl: 0  Review of Systems:  Constitutional: Denies fever, chills, diaphoresis, appetite change and fatigue.  HEENT: Denies photophobia, eye pain, redness, hearing loss, ear pain, congestion, sore throat, rhinorrhea, sneezing, mouth sores, trouble swallowing, neck pain, neck stiffness and tinnitus.   Respiratory: Denies SOB, DOE, cough, chest tightness,  and wheezing.   Cardiovascular: Denies chest pain, palpitations and leg swelling.  Gastrointestinal: Denies nausea, vomiting, abdominal pain, diarrhea, constipation, blood in stool and abdominal distention.  Genitourinary: Denies dysuria, urgency, frequency, hematuria, flank pain and difficulty urinating.  Endocrine: Denies: hot or cold intolerance, sweats, changes in hair or nails, polyuria, polydipsia. Musculoskeletal: Denies myalgias, back pain, joint swelling, arthralgias and gait problem.  Skin: Denies pallor, rash and wound.  Neurological: Denies dizziness, seizures, syncope, weakness, light-headedness, numbness and headaches.  Hematological: Denies adenopathy. Easy bruising, personal  or family bleeding history  Psychiatric/Behavioral: Denies suicidal ideation, mood changes, confusion, nervousness, sleep disturbance and agitation    Physical Exam: Vitals:   01/14/21 1301  BP: 110/76  Pulse: (!) 117  Temp: 97.6  F (36.4 C)  TempSrc: Oral  SpO2: 98%  Weight: 207 lb 1.6 oz (93.9 kg)    Body mass index is 31.49 kg/m.   Constitutional: NAD, calm, comfortable Eyes: PERRL, lids and conjunctivae normal ENMT: Mucous membranes are moist.  Respiratory: clear to auscultation bilaterally, no wheezing, no crackles. Normal respiratory effort. No accessory muscle use.  Cardiovascular: Regular rate and rhythm, no murmurs / rubs / gallops.  Neurologic: Grossly intact and nonfocal Psychiatric: Normal judgment and insight. Alert and oriented x 3. Normal mood.    Impression and Plan:  Attention deficit hyperactivity disorder (ADHD), predominantly inattentive type -PDMP reviewed, no red flags, overdose risk score is 190. -Refill Adderall XR 30 mg to take 1 tablet daily for total of 30 tablets a month x3 months.  Primary hypertension -Blood pressure is currently well controlled on his current medication of lisinopril 10 mg and hydrochlorothiazide 25 mg daily. -Check renal function and electrolytes when he returns for CPE.    Patient Instructions  -Nice seeing you today!!  -Schedule follow up for your physical in 3 months. Please come in fasting.     Chaya Jan, MD [facility] at [facility]

## 2021-02-13 ENCOUNTER — Encounter: Payer: Self-pay | Admitting: Internal Medicine

## 2021-02-14 ENCOUNTER — Other Ambulatory Visit: Payer: Self-pay | Admitting: Internal Medicine

## 2021-02-14 DIAGNOSIS — I10 Essential (primary) hypertension: Secondary | ICD-10-CM

## 2021-02-18 ENCOUNTER — Encounter: Payer: Self-pay | Admitting: Internal Medicine

## 2021-03-11 ENCOUNTER — Ambulatory Visit (INDEPENDENT_AMBULATORY_CARE_PROVIDER_SITE_OTHER): Payer: BC Managed Care – PPO | Admitting: Psychologist

## 2021-03-11 DIAGNOSIS — F411 Generalized anxiety disorder: Secondary | ICD-10-CM | POA: Diagnosis not present

## 2021-03-11 DIAGNOSIS — F321 Major depressive disorder, single episode, moderate: Secondary | ICD-10-CM | POA: Diagnosis not present

## 2021-03-16 ENCOUNTER — Other Ambulatory Visit: Payer: Self-pay | Admitting: Internal Medicine

## 2021-03-16 DIAGNOSIS — F900 Attention-deficit hyperactivity disorder, predominantly inattentive type: Secondary | ICD-10-CM

## 2021-03-20 NOTE — Telephone Encounter (Signed)
Rx filled 03/17/21

## 2021-03-25 ENCOUNTER — Ambulatory Visit: Payer: Self-pay | Admitting: Psychologist

## 2021-04-03 ENCOUNTER — Ambulatory Visit (INDEPENDENT_AMBULATORY_CARE_PROVIDER_SITE_OTHER): Payer: BC Managed Care – PPO | Admitting: Psychologist

## 2021-04-03 DIAGNOSIS — F411 Generalized anxiety disorder: Secondary | ICD-10-CM | POA: Diagnosis not present

## 2021-04-03 DIAGNOSIS — F321 Major depressive disorder, single episode, moderate: Secondary | ICD-10-CM | POA: Diagnosis not present

## 2021-04-15 ENCOUNTER — Other Ambulatory Visit: Payer: Self-pay | Admitting: Internal Medicine

## 2021-04-15 DIAGNOSIS — F900 Attention-deficit hyperactivity disorder, predominantly inattentive type: Secondary | ICD-10-CM

## 2021-04-16 ENCOUNTER — Encounter: Payer: Self-pay | Admitting: Internal Medicine

## 2021-04-16 DIAGNOSIS — F900 Attention-deficit hyperactivity disorder, predominantly inattentive type: Secondary | ICD-10-CM

## 2021-04-17 ENCOUNTER — Other Ambulatory Visit: Payer: Self-pay

## 2021-04-18 ENCOUNTER — Ambulatory Visit (INDEPENDENT_AMBULATORY_CARE_PROVIDER_SITE_OTHER): Payer: BC Managed Care – PPO | Admitting: Psychologist

## 2021-04-18 ENCOUNTER — Ambulatory Visit: Payer: BC Managed Care – PPO | Admitting: Internal Medicine

## 2021-04-18 ENCOUNTER — Encounter: Payer: Self-pay | Admitting: Internal Medicine

## 2021-04-18 DIAGNOSIS — F321 Major depressive disorder, single episode, moderate: Secondary | ICD-10-CM

## 2021-04-18 DIAGNOSIS — F900 Attention-deficit hyperactivity disorder, predominantly inattentive type: Secondary | ICD-10-CM

## 2021-04-18 DIAGNOSIS — F411 Generalized anxiety disorder: Secondary | ICD-10-CM

## 2021-04-18 MED ORDER — AMPHETAMINE-DEXTROAMPHET ER 30 MG PO CP24: 30.0000 mg | ORAL_CAPSULE | ORAL | 0 refills | Status: DC

## 2021-04-18 NOTE — Progress Notes (Signed)
Established Patient Office Visit     This visit occurred during the SARS-CoV-2 public health emergency.  Safety protocols were in place, including screening questions prior to the visit, additional usage of staff PPE, and extensive cleaning of exam room while observing appropriate contact time as indicated for disinfecting solutions.    CC/Reason for Visit: Medication refills  HPI: Walter Roberson is a 35 y.o. male who is coming in today for the above mentioned reasons. Past Medical History is significant for: Hypertension that has been well controlled and ADHD.  He is on Adderall and is here today for his medication check per contract for refills.  He has been doing well on this dose, has no acute complaints, no untoward side effects.  He has been seeing orthopedics for left knee pain and is scheduled for an MRI soon.   Past Medical/Surgical History: Past Medical History:  Diagnosis Date   ADHD    HTN (hypertension)    Overweight (BMI 25.0-29.9)     Past Surgical History:  Procedure Laterality Date   CARPAL TUNNEL RELEASE Right    FINGER CLOSED REDUCTION     KNEE ARTHROSCOPY WITH OSTEOCHONDRAL DEFECT REPAIR Left 03/01/2019   Procedure: LEFT KNEE ARTHROSCOPY WITH OSTEOCHARAL DEFECT FIXATION MICROFRACTURE;  Surgeon: Dannielle Huh, MD;  Location: [facility];  Service: Orthopedics;  Laterality: Left;   ORIF ANKLE FRACTURE BIMALLEOLAR Left    x2   REPAIR ANKLE LIGAMENT Left     Social History:  reports that he has never smoked. He has never used smokeless tobacco. He reports current alcohol use of about 4.0 standard drinks of alcohol per week. He reports that he does not use drugs.  Allergies: No Known Allergies  Family History:  Family History  Problem Relation Age of Onset   Hypertension Father    Diabetes Brother      Current Outpatient Medications:    acetaminophen (TYLENOL) 325 MG tablet, Take 650 mg by mouth every 6 (six) hours as needed for  mild pain, fever or headache., Disp: , Rfl:    amphetamine-dextroamphetamine (ADDERALL XR) 30 MG 24 hr capsule, Take 1 capsule (30 mg total) by mouth every morning., Disp: 30 capsule, Rfl: 0   hydrochlorothiazide (HYDRODIURIL) 25 MG tablet, TAKE 1 TABLET BY MOUTH EVERY DAY, Disp: 90 tablet, Rfl: 1   lisinopril (ZESTRIL) 10 MG tablet, TAKE 1 TABLET BY MOUTH EVERY DAY, Disp: 90 tablet, Rfl: 1   amphetamine-dextroamphetamine (ADDERALL XR) 30 MG 24 hr capsule, Take 1 capsule (30 mg total) by mouth every morning., Disp: 30 capsule, Rfl: 0   amphetamine-dextroamphetamine (ADDERALL XR) 30 MG 24 hr capsule, Take 1 capsule (30 mg total) by mouth every morning., Disp: 30 capsule, Rfl: 0  Review of Systems:  Constitutional: Denies fever, chills, diaphoresis, appetite change and fatigue.  HEENT: Denies photophobia, eye pain, redness, hearing loss, ear pain, congestion, sore throat, rhinorrhea, sneezing, mouth sores, trouble swallowing, neck pain, neck stiffness and tinnitus.   Respiratory: Denies SOB, DOE, cough, chest tightness,  and wheezing.   Cardiovascular: Denies chest pain, palpitations and leg swelling.  Gastrointestinal: Denies nausea, vomiting, abdominal pain, diarrhea, constipation, blood in stool and abdominal distention.  Genitourinary: Denies dysuria, urgency, frequency, hematuria, flank pain and difficulty urinating.  Endocrine: Denies: hot or cold intolerance, sweats, changes in hair or nails, polyuria, polydipsia. Musculoskeletal: Denies myalgias, back pain, joint swelling, arthralgias and gait problem.  Skin: Denies pallor, rash and wound.  Neurological: Denies dizziness, seizures, syncope, weakness, light-headedness,  numbness and headaches.  Hematological: Denies adenopathy. Easy bruising, personal or family bleeding history  Psychiatric/Behavioral: Denies suicidal ideation, mood changes, confusion, nervousness, sleep disturbance and agitation    Physical Exam: Vitals:   04/18/21 0705   BP: 120/84  Pulse: 67  SpO2: 97%  Weight: 203 lb (92.1 kg)    Body mass index is 30.87 kg/m.   Constitutional: NAD, calm, comfortable Eyes: PERRL, lids and conjunctivae normal ENMT: Mucous membranes are moist.  Respiratory: clear to auscultation bilaterally, no wheezing, no crackles. Normal respiratory effort. No accessory muscle use.  Cardiovascular: Regular rate and rhythm, no murmurs / rubs / gallops. No extremity edema.  Neurologic: Grossly intact and nonfocal Psychiatric: Normal judgment and insight. Alert and oriented x 3. Normal mood.    Impression and Plan:  Attention deficit hyperactivity disorder (ADHD), predominantly inattentive type  -PDMP reviewed, no red flags, overdose risk score is 0. -Refill Adderall XR 30 mg to take 1 tablet daily for total of 30 tablets a month x3 months.  Hypertension -Well-controlled on hydrochlorothiazide and lisinopril.    Chaya Jan, MD [facility] at [facility]

## 2021-05-08 ENCOUNTER — Ambulatory Visit: Payer: BC Managed Care – PPO | Admitting: Psychologist

## 2021-05-16 ENCOUNTER — Telehealth: Payer: Self-pay | Admitting: Internal Medicine

## 2021-05-16 NOTE — Telephone Encounter (Signed)
Pt is calling in needing a refill on Rx amphetamine-dextroamphetamine (ADDERALL XR) 30 MG  Pharm:  [facility] [address]

## 2021-05-16 NOTE — Telephone Encounter (Signed)
Not due until 05/18/21.  Refill available at the pharmacy.

## 2021-05-20 ENCOUNTER — Other Ambulatory Visit: Payer: Self-pay | Admitting: Internal Medicine

## 2021-05-20 DIAGNOSIS — F900 Attention-deficit hyperactivity disorder, predominantly inattentive type: Secondary | ICD-10-CM

## 2021-05-20 MED ORDER — AMPHETAMINE-DEXTROAMPHET ER 30 MG PO CP24: 30.0000 mg | ORAL_CAPSULE | ORAL | 0 refills | Status: DC

## 2021-05-20 NOTE — Addendum Note (Signed)
Addended by: Kern Reap B on: 05/20/2021 11:30 AM   Modules accepted: Orders

## 2021-05-20 NOTE — Telephone Encounter (Signed)
Patient called to check on the status of his refill request for his amphetamine-dextroamphetamine (ADDERALL XR) 30 MG to [facility] to [id number] [place],  - [address]. Informed patient that refill was not due until 05/18/21.  Patient asked if message could be sent back for the refill to be sent to the pharmacy today since it is due.  Please advise.

## 2021-05-20 NOTE — Telephone Encounter (Signed)
Rx sent to [facility] as [facility] not [facility] the [facility].

## 2021-05-20 NOTE — Telephone Encounter (Signed)
Sp[facility] and the medication is not available at this time.  Okay to send refill to [facility]?

## 2021-05-28 DIAGNOSIS — M25562 Pain in left knee: Secondary | ICD-10-CM | POA: Diagnosis not present

## 2021-06-02 ENCOUNTER — Other Ambulatory Visit: Payer: Self-pay | Admitting: Internal Medicine

## 2021-06-02 DIAGNOSIS — I10 Essential (primary) hypertension: Secondary | ICD-10-CM

## 2021-06-04 DIAGNOSIS — M25562 Pain in left knee: Secondary | ICD-10-CM | POA: Diagnosis not present

## 2021-06-06 DIAGNOSIS — M25562 Pain in left knee: Secondary | ICD-10-CM | POA: Diagnosis not present

## 2021-06-19 ENCOUNTER — Encounter: Payer: Self-pay | Admitting: Internal Medicine

## 2021-07-21 ENCOUNTER — Encounter: Payer: Self-pay | Admitting: Internal Medicine

## 2021-07-21 ENCOUNTER — Other Ambulatory Visit: Payer: Self-pay | Admitting: Internal Medicine

## 2021-07-21 DIAGNOSIS — F900 Attention-deficit hyperactivity disorder, predominantly inattentive type: Secondary | ICD-10-CM

## 2021-08-04 DIAGNOSIS — M2342 Loose body in knee, left knee: Secondary | ICD-10-CM | POA: Diagnosis not present

## 2021-08-04 DIAGNOSIS — M2242 Chondromalacia patellae, left knee: Secondary | ICD-10-CM | POA: Diagnosis not present

## 2021-08-04 DIAGNOSIS — M2341 Loose body in knee, right knee: Secondary | ICD-10-CM | POA: Diagnosis not present

## 2021-08-04 DIAGNOSIS — M948X6 Other specified disorders of cartilage, lower leg: Secondary | ICD-10-CM | POA: Diagnosis not present

## 2021-08-05 DIAGNOSIS — M6281 Muscle weakness (generalized): Secondary | ICD-10-CM | POA: Diagnosis not present

## 2021-08-05 DIAGNOSIS — R262 Difficulty in walking, not elsewhere classified: Secondary | ICD-10-CM | POA: Diagnosis not present

## 2021-08-05 DIAGNOSIS — M25562 Pain in left knee: Secondary | ICD-10-CM | POA: Diagnosis not present

## 2021-08-12 DIAGNOSIS — M25562 Pain in left knee: Secondary | ICD-10-CM | POA: Diagnosis not present

## 2021-08-20 ENCOUNTER — Encounter: Payer: Self-pay | Admitting: Internal Medicine

## 2021-08-20 DIAGNOSIS — F900 Attention-deficit hyperactivity disorder, predominantly inattentive type: Secondary | ICD-10-CM

## 2021-08-22 MED ORDER — AMPHETAMINE-DEXTROAMPHET ER 30 MG PO CP24: 30.0000 mg | ORAL_CAPSULE | ORAL | 0 refills | Status: DC

## 2021-09-04 ENCOUNTER — Other Ambulatory Visit: Payer: Self-pay

## 2021-09-04 ENCOUNTER — Encounter (HOSPITAL_BASED_OUTPATIENT_CLINIC_OR_DEPARTMENT_OTHER): Payer: Self-pay | Admitting: Orthopaedic Surgery

## 2021-09-05 ENCOUNTER — Encounter (HOSPITAL_BASED_OUTPATIENT_CLINIC_OR_DEPARTMENT_OTHER)
Admission: RE | Admit: 2021-09-05 | Discharge: 2021-09-05 | Disposition: A | Discharge status: Home / Self Care | Payer: BC Managed Care – PPO | Source: Ambulatory Visit | Attending: Orthopaedic Surgery | Admitting: Orthopaedic Surgery

## 2021-09-05 ENCOUNTER — Other Ambulatory Visit: Payer: Self-pay

## 2021-09-05 DIAGNOSIS — Z79899 Other long term (current) drug therapy: Secondary | ICD-10-CM | POA: Diagnosis not present

## 2021-09-05 DIAGNOSIS — M21862 Other specified acquired deformities of left lower leg: Secondary | ICD-10-CM | POA: Diagnosis not present

## 2021-09-05 DIAGNOSIS — I10 Essential (primary) hypertension: Secondary | ICD-10-CM | POA: Diagnosis not present

## 2021-09-05 DIAGNOSIS — M2342 Loose body in knee, left knee: Secondary | ICD-10-CM | POA: Diagnosis not present

## 2021-09-05 LAB — BASIC METABOLIC PANEL
Anion gap: 10 (ref 5–15)
BUN: 12 mg/dL (ref 6–20)
CO2: 27 mmol/L (ref 22–32)
Calcium: 9.3 mg/dL (ref 8.9–10.3)
Chloride: 94 mmol/L — ABNORMAL LOW (ref 98–111)
Creatinine, Ser: 1.11 mg/dL (ref 0.61–1.24)
GFR, Estimated: >60 mL/min (ref >60)
Glucose, Bld: 125 mg/dL — ABNORMAL HIGH (ref 70–99)
Potassium: 3.9 mmol/L (ref 3.5–5.1)
Sodium: 131 mmol/L — ABNORMAL LOW (ref 135–145)

## 2021-09-05 NOTE — Progress Notes (Signed)

## 2021-09-09 NOTE — Anesthesia Preprocedure Evaluation (Addendum)
Anesthesia Evaluation  Patient identified by MRN, date of birth, ID band Patient awake    Reviewed: Allergy & Precautions, NPO status , Patient's Chart, lab work & pertinent test results  History of Anesthesia Complications Negative for: history of anesthetic complications  Airway Mallampati: I       Dental no notable dental hx.    Pulmonary neg pulmonary ROS,    Pulmonary exam normal        Cardiovascular hypertension, Pt. on medications Normal cardiovascular exam     Neuro/Psych negative neurological ROS     GI/Hepatic negative GI ROS, Neg liver ROS,   Endo/Other  negative endocrine ROS  Renal/GU negative Renal ROS  negative genitourinary   Musculoskeletal negative musculoskeletal ROS (+)   Abdominal Normal abdominal exam  (+)   Peds  (+) ADHD Hematology negative hematology ROS (+)   Anesthesia Other Findings   Reproductive/Obstetrics                            Anesthesia Physical  Anesthesia Plan  ASA: 2  Anesthesia Plan: General   Post-op Pain Management:    Induction: Intravenous  PONV Risk Score and Plan: 2 and Ondansetron, Dexamethasone, Midazolam and Treatment may vary due to age or medical condition  Airway Management Planned: LMA  Additional Equipment: None  Intra-op Plan:   Post-operative Plan: Extubation in OR  Informed Consent: I have reviewed the patients History and Physical, chart, labs and discussed the procedure including the risks, benefits and alternatives for the proposed anesthesia with the patient or authorized representative who has indicated his/her understanding and acceptance.     Dental advisory given  Plan Discussed with: CRNA  Anesthesia Plan Comments:        Anesthesia Quick Evaluation

## 2021-09-09 NOTE — H&P (Signed)
PREOPERATIVE H&P  Chief Complaint: LEFT KNEE CHONDRAL DEFECT, LOOSE BODIES  HPI: Walter Roberson is a 35 y.o. male who is scheduled for, Procedure(s): OSTEOCHONDRAL DEFECT REPAIR/RECONSTRUCTION CHONDROPLASTY.   Patient has a past medical history significant for HTN.   Patient is a 35 year-old who has a history of a microfracture with Dr. Sherlean Foot in April of 2020.  He has continued to have medial knee pain.  He is not able to play tennis.  He is a Government social research officer.  He wants to play tennis and wants to be active like would be normal for his age.  He does not have catching and locking, but he does have occasional swelling.  He has pain in the knee primarily on the medial side.  He had a diagnostic knee arthroscopy. He was found to have a large cartilage defect and multiple loose bodies. He requires an osteochondral allograft transplantation.  His symptoms are rated as moderate to severe, and have been worsening.  This is significantly impairing activities of daily living.    Please see clinic note for further details on this patient's care.    He has elected for surgical management.   Past Medical History:  Diagnosis Date   ADHD    HTN (hypertension)    Overweight (BMI 25.0-29.9)    Past Surgical History:  Procedure Laterality Date   CARPAL TUNNEL RELEASE Right    FINGER CLOSED REDUCTION     KNEE ARTHROSCOPY WITH OSTEOCHONDRAL DEFECT REPAIR Left 03/01/2019   Procedure: LEFT KNEE ARTHROSCOPY WITH OSTEOCHARAL DEFECT FIXATION MICROFRACTURE;  Surgeon: Dannielle Huh, MD;  Location: [facility];  Service: Orthopedics;  Laterality: Left;   ORIF ANKLE FRACTURE BIMALLEOLAR Left    x2   REPAIR ANKLE LIGAMENT Left    Social History   Socioeconomic History   Marital status: Divorced    Spouse name: Not on file   Number of children: Not on file   Years of education: Not on file   Highest education level: Not on file  Occupational History   Not on file  Tobacco Use    Smoking status: Never   Smokeless tobacco: Never  Vaping Use   Vaping Use: Never used  Substance and Sexual Activity   Alcohol use: Yes    Alcohol/week: 4.0 standard drinks    Types: 4 Cans of beer per week    Comment: a week   Drug use: Never   Sexual activity: Not on file  Other Topics Concern   Not on file  Social History Narrative   Not on file   Social Determinants of Health   Financial Resource Strain: Not on file  Food Insecurity: Not on file  Transportation Needs: Not on file  Physical Activity: Not on file  Stress: Not on file  Social Connections: Not on file   Family History  Problem Relation Age of Onset   Hypertension Father    Diabetes Brother    No Known Allergies Prior to Admission medications   Medication Sig Start Date End Date Taking? Authorizing Provider  acetaminophen (TYLENOL) 325 MG tablet Take 650 mg by mouth every 6 (six) hours as needed for mild pain, fever or headache.   Yes [provider]  amphetamine-dextroamphetamine (ADDERALL XR) 30 MG 24 hr capsule Take 1 capsule (30 mg total) by mouth every morning. 04/18/21  Yes Philip Aspen, Limmie Patricia, MD  hydrochlorothiazide (HYDRODIURIL) 25 MG tablet TAKE 1 TABLET BY MOUTH EVERY DAY 02/14/21  Yes Ardyth Harps  Priscella Mann, MD  lisinopril (ZESTRIL) 10 MG tablet TAKE 1 TABLET BY MOUTH EVERY DAY 06/02/21  Yes Philip Aspen, Limmie Patricia, MD  amphetamine-dextroamphetamine (ADDERALL XR) 30 MG 24 hr capsule Take 1 capsule (30 mg total) by mouth every morning. 04/18/21   Henderson Cloud, MD  amphetamine-dextroamphetamine (ADDERALL XR) 30 MG 24 hr capsule Take 1 capsule (30 mg total) by mouth every morning. 08/22/21   Wynn Banker, MD    ROS: All other systems have been reviewed and were otherwise negative with the exception of those mentioned in the HPI and as above.  Physical Exam: General: Alert, no acute distress Cardiovascular: No pedal edema Respiratory: No cyanosis, no use of  accessory musculature GI: No organomegaly, abdomen is soft and non-tender Skin: No lesions in the area of chief complaint Neurologic: Sensation intact distally Psychiatric: Patient is competent for consent with normal mood and affect Lymphatic: No axillary or cervical lymphadenopathy  MUSCULOSKELETAL:  Range of motion of the knee is full.  He is non-tender to palpation today.  No obvious effusion.    Imaging: X-rays and MRI reviewed.  MRI demonstrates full thickness 3 x 1 cartilage loss along the medial femoral condyle.  No obvious meniscal pathology.  He has bony overload type symptoms with bony edema as well.  On long leg views his axis is normal through the mid point of his knee.    Diagnostic knee arthroscopy we were able to see a cartilage lesion approximately 84mm x 56mm, and 10 x 32mm lesion that appeared to have been microfractured in the past.   Assessment: LEFT KNEE CHONDRAL DEFECT, LOOSE BODIES  Plan: Plan for Procedure(s): OSTEOCHONDRAL DEFECT REPAIR/RECONSTRUCTION CHONDROPLASTY  The risks benefits and alternatives were discussed with the patient including but not limited to the risks of nonoperative treatment, versus surgical intervention including infection, bleeding, nerve injury,  blood clots, cardiopulmonary complications, morbidity, mortality, among others, and they were willing to proceed.   The patient acknowledged the explanation, agreed to proceed with the plan and consent was signed.   Operative Plan: We will plan on scoping the knee to clear any loose fragments before going on with osteochondral allograft transplantation Discharge Medications: Standard DVT Prophylaxis: Aspirin Physical Therapy: Outpatient PT. CPM Special Discharge needs: Knee immobilizer. IceMan   Vernetta Honey, PA-C  09/09/2021 8:35 AM

## 2021-09-10 ENCOUNTER — Ambulatory Visit (HOSPITAL_BASED_OUTPATIENT_CLINIC_OR_DEPARTMENT_OTHER): Payer: BC Managed Care – PPO | Admitting: Certified Registered"

## 2021-09-10 ENCOUNTER — Encounter (HOSPITAL_BASED_OUTPATIENT_CLINIC_OR_DEPARTMENT_OTHER): Payer: Self-pay | Admitting: Orthopaedic Surgery

## 2021-09-10 ENCOUNTER — Ambulatory Visit (HOSPITAL_BASED_OUTPATIENT_CLINIC_OR_DEPARTMENT_OTHER)
Admission: RE | Admit: 2021-09-10 | Discharge: 2021-09-10 | Disposition: A | Discharge status: Home / Self Care | Payer: BC Managed Care – PPO | Source: Ambulatory Visit | Attending: Orthopaedic Surgery | Admitting: Orthopaedic Surgery

## 2021-09-10 ENCOUNTER — Encounter (HOSPITAL_BASED_OUTPATIENT_CLINIC_OR_DEPARTMENT_OTHER)
Admission: RE | Disposition: A | Discharge status: Home / Self Care | Payer: Self-pay | Source: Ambulatory Visit | Attending: Orthopaedic Surgery

## 2021-09-10 ENCOUNTER — Other Ambulatory Visit: Payer: Self-pay

## 2021-09-10 DIAGNOSIS — M958 Other specified acquired deformities of musculoskeletal system: Secondary | ICD-10-CM | POA: Diagnosis not present

## 2021-09-10 DIAGNOSIS — M21862 Other specified acquired deformities of left lower leg: Secondary | ICD-10-CM | POA: Diagnosis not present

## 2021-09-10 DIAGNOSIS — Z79899 Other long term (current) drug therapy: Secondary | ICD-10-CM | POA: Diagnosis not present

## 2021-09-10 DIAGNOSIS — M94262 Chondromalacia, left knee: Secondary | ICD-10-CM | POA: Diagnosis not present

## 2021-09-10 DIAGNOSIS — M2342 Loose body in knee, left knee: Secondary | ICD-10-CM | POA: Insufficient documentation

## 2021-09-10 DIAGNOSIS — I10 Essential (primary) hypertension: Secondary | ICD-10-CM | POA: Insufficient documentation

## 2021-09-10 DIAGNOSIS — S83202A Bucket-handle tear of unspecified meniscus, current injury, unspecified knee, initial encounter: Secondary | ICD-10-CM | POA: Diagnosis not present

## 2021-09-10 HISTORY — PX: KNEE ARTHROSCOPY: SHX127

## 2021-09-10 HISTORY — PX: OSTEOCHONDRAL DEFECT REPAIR/RECONSTRUCTION: SHX6232

## 2021-09-10 HISTORY — PX: CHONDROPLASTY: SHX5177

## 2021-09-10 SURGERY — APPLICATION, GRAFT, OSTEOCHONDRAL, KNEE
Anesthesia: General | Site: Knee | Laterality: Left

## 2021-09-10 MED ORDER — VANCOMYCIN HCL 1000 MG IV SOLR
INTRAVENOUS | Status: AC
  Filled 2021-09-10: qty 40

## 2021-09-10 MED ORDER — OXYCODONE HCL 5 MG/5ML PO SOLN: 5.0000 mg | Freq: Once | ORAL | Status: AC | PRN

## 2021-09-10 MED ORDER — VANCOMYCIN HCL 500 MG IV SOLR
INTRAVENOUS | Status: AC
  Filled 2021-09-10: qty 10

## 2021-09-10 MED ORDER — FENTANYL CITRATE (PF) 100 MCG/2ML IJ SOLN
INTRAMUSCULAR | Status: AC
  Filled 2021-09-10: qty 2

## 2021-09-10 MED ORDER — BUPIVACAINE HCL (PF) 0.25 % IJ SOLN
INTRAMUSCULAR | Status: DC | PRN
  Administered 2021-09-10: 30 mL via INTRA_ARTICULAR

## 2021-09-10 MED ORDER — KETOROLAC TROMETHAMINE 30 MG/ML IJ SOLN
INTRAMUSCULAR | Status: AC
  Filled 2021-09-10: qty 1

## 2021-09-10 MED ORDER — ONDANSETRON HCL 4 MG/2ML IJ SOLN: 4.0000 mg | Freq: Once | INTRAMUSCULAR | Status: DC | PRN

## 2021-09-10 MED ORDER — LIDOCAINE HCL (CARDIAC) PF 100 MG/5ML IV SOSY
PREFILLED_SYRINGE | INTRAVENOUS | Status: DC | PRN
  Administered 2021-09-10: 100 mg via INTRAVENOUS

## 2021-09-10 MED ORDER — KETOROLAC TROMETHAMINE 30 MG/ML IJ SOLN: 30.0000 mg | Freq: Once | INTRAMUSCULAR | Status: DC | PRN

## 2021-09-10 MED ORDER — CEFAZOLIN SODIUM-DEXTROSE 2-4 GM/100ML-% IV SOLN
INTRAVENOUS | Status: AC
  Filled 2021-09-10: qty 100

## 2021-09-10 MED ORDER — METHOCARBAMOL 500 MG PO TABS: 500.0000 mg | ORAL_TABLET | Freq: Three times a day (TID) | ORAL | 0 refills | Status: AC | PRN

## 2021-09-10 MED ORDER — HYDROMORPHONE HCL 1 MG/ML IJ SOLN
0.2500 mg | INTRAMUSCULAR | Status: DC | PRN
Start: 2021-09-10 — End: 2021-09-10
  Administered 2021-09-10: 0.5 mg via INTRAVENOUS
  Administered 2021-09-10 (×2): 0.25 mg via INTRAVENOUS

## 2021-09-10 MED ORDER — HEPARIN SODIUM (PORCINE) 5000 UNIT/ML IJ SOLN
INTRAMUSCULAR | Status: AC
  Filled 2021-09-10: qty 1

## 2021-09-10 MED ORDER — VANCOMYCIN HCL 1 G IV SOLR
INTRAVENOUS | Status: DC | PRN
  Administered 2021-09-10: 1000 mg via TOPICAL

## 2021-09-10 MED ORDER — DICLOFENAC SODIUM 75 MG PO TBEC: 75.0000 mg | DELAYED_RELEASE_TABLET | Freq: Two times a day (BID) | ORAL | 0 refills | Status: DC

## 2021-09-10 MED ORDER — ASPIRIN 81 MG PO CHEW: 81.0000 mg | CHEWABLE_TABLET | Freq: Two times a day (BID) | ORAL | 0 refills | Status: AC

## 2021-09-10 MED ORDER — HEPARIN SODIUM (PORCINE) 1000 UNIT/ML IJ SOLN
INTRAMUSCULAR | Status: AC
  Filled 2021-09-10: qty 3

## 2021-09-10 MED ORDER — LACTATED RINGERS IV SOLN: INTRAVENOUS | Status: DC

## 2021-09-10 MED ORDER — OXYCODONE HCL 5 MG PO TABS: ORAL_TABLET | ORAL | 0 refills | Status: AC

## 2021-09-10 MED ORDER — DEXMEDETOMIDINE (PRECEDEX) IN NS 20 MCG/5ML (4 MCG/ML) IV SYRINGE
PREFILLED_SYRINGE | INTRAVENOUS | Status: AC
  Filled 2021-09-10: qty 5

## 2021-09-10 MED ORDER — SODIUM CHLORIDE 0.9 % IR SOLN
Status: DC | PRN
  Administered 2021-09-10: 3000 mL
  Administered 2021-09-10: 1500 mL

## 2021-09-10 MED ORDER — OXYCODONE HCL 5 MG PO TABS
5.0000 mg | ORAL_TABLET | Freq: Once | ORAL | Status: AC | PRN
  Administered 2021-09-10: 5 mg via ORAL

## 2021-09-10 MED ORDER — OXYCODONE HCL 5 MG PO TABS
ORAL_TABLET | ORAL | Status: AC
  Filled 2021-09-10: qty 1

## 2021-09-10 MED ORDER — MIDAZOLAM HCL 2 MG/2ML IJ SOLN
INTRAMUSCULAR | Status: AC
  Filled 2021-09-10: qty 2

## 2021-09-10 MED ORDER — HYDROMORPHONE HCL 1 MG/ML IJ SOLN
INTRAMUSCULAR | Status: AC
  Filled 2021-09-10: qty 0.5

## 2021-09-10 MED ORDER — ACETAMINOPHEN 325 MG PO TABS: 325.0000 mg | ORAL_TABLET | ORAL | Status: DC | PRN

## 2021-09-10 MED ORDER — ONDANSETRON HCL 4 MG/2ML IJ SOLN
INTRAMUSCULAR | Status: AC
  Filled 2021-09-10: qty 2

## 2021-09-10 MED ORDER — ONDANSETRON HCL 4 MG/2ML IJ SOLN
INTRAMUSCULAR | Status: DC | PRN
  Administered 2021-09-10: 4 mg via INTRAVENOUS

## 2021-09-10 MED ORDER — ONDANSETRON HCL 4 MG PO TABS: 4.0000 mg | ORAL_TABLET | Freq: Three times a day (TID) | ORAL | 0 refills | Status: AC | PRN

## 2021-09-10 MED ORDER — ACETAMINOPHEN 160 MG/5ML PO SOLN: 325.0000 mg | ORAL | Status: DC | PRN

## 2021-09-10 MED ORDER — LIDOCAINE 2% (20 MG/ML) 5 ML SYRINGE
INTRAMUSCULAR | Status: AC
  Filled 2021-09-10: qty 5

## 2021-09-10 MED ORDER — PROPOFOL 10 MG/ML IV BOLUS
INTRAVENOUS | Status: AC
  Filled 2021-09-10: qty 40

## 2021-09-10 MED ORDER — DEXMEDETOMIDINE (PRECEDEX) IN NS 20 MCG/5ML (4 MCG/ML) IV SYRINGE
PREFILLED_SYRINGE | INTRAVENOUS | Status: DC | PRN
  Administered 2021-09-10 (×2): 8 ug via INTRAVENOUS
  Administered 2021-09-10: 4 ug via INTRAVENOUS

## 2021-09-10 MED ORDER — ACETAMINOPHEN 500 MG PO TABS: 1000.0000 mg | ORAL_TABLET | Freq: Three times a day (TID) | ORAL | 0 refills | Status: AC

## 2021-09-10 MED ORDER — KETOROLAC TROMETHAMINE 30 MG/ML IJ SOLN
INTRAMUSCULAR | Status: DC | PRN
  Administered 2021-09-10: 30 mg via INTRAVENOUS

## 2021-09-10 MED ORDER — FENTANYL CITRATE (PF) 100 MCG/2ML IJ SOLN
INTRAMUSCULAR | Status: DC | PRN
  Administered 2021-09-10 (×2): 50 ug via INTRAVENOUS
  Administered 2021-09-10: 25 ug via INTRAVENOUS
  Administered 2021-09-10: 50 ug via INTRAVENOUS
  Administered 2021-09-10: 25 ug via INTRAVENOUS
  Administered 2021-09-10 (×2): 50 ug via INTRAVENOUS

## 2021-09-10 MED ORDER — MEPERIDINE HCL 25 MG/ML IJ SOLN: 6.2500 mg | INTRAMUSCULAR | Status: DC | PRN

## 2021-09-10 MED ORDER — DEXAMETHASONE SODIUM PHOSPHATE 10 MG/ML IJ SOLN
INTRAMUSCULAR | Status: DC | PRN
  Administered 2021-09-10: 10 mg via INTRAVENOUS

## 2021-09-10 MED ORDER — BUPIVACAINE HCL (PF) 0.25 % IJ SOLN
INTRAMUSCULAR | Status: AC
  Filled 2021-09-10: qty 60

## 2021-09-10 MED ORDER — CEFAZOLIN SODIUM-DEXTROSE 2-4 GM/100ML-% IV SOLN
2.0000 g | INTRAVENOUS | Status: AC
  Administered 2021-09-10: 2 g via INTRAVENOUS

## 2021-09-10 MED ORDER — MIDAZOLAM HCL 5 MG/5ML IJ SOLN
INTRAMUSCULAR | Status: DC | PRN
  Administered 2021-09-10: 2 mg via INTRAVENOUS

## 2021-09-10 MED ORDER — PROPOFOL 10 MG/ML IV BOLUS
INTRAVENOUS | Status: DC | PRN
  Administered 2021-09-10: 100 mg via INTRAVENOUS
  Administered 2021-09-10: 200 mg via INTRAVENOUS

## 2021-09-10 MED ORDER — DEXAMETHASONE SODIUM PHOSPHATE 10 MG/ML IJ SOLN
INTRAMUSCULAR | Status: AC
  Filled 2021-09-10: qty 2

## 2021-09-10 SURGICAL SUPPLY — 77 items
APL PRP STRL LF DISP 70% ISPRP (MISCELLANEOUS) ×2
BANDAGE ESMARK 6X9 LF (GAUZE/BANDAGES/DRESSINGS) IMPLANT
BLADE CLIPPER SURG (BLADE) IMPLANT
BLADE HEX COATED 2.75 (ELECTRODE) ×2 IMPLANT
BLADE MICRO SAGITTAL (BLADE) ×1 IMPLANT
BLADE SURG 10 STRL SS (BLADE) ×3 IMPLANT
BLADE SURG 15 STRL LF DISP TIS (BLADE) ×2 IMPLANT
BLADE SURG 15 STRL SS (BLADE) ×3
BNDG CMPR 9X6 STRL LF SNTH (GAUZE/BANDAGES/DRESSINGS)
BNDG COHESIVE 4X5 TAN ST LF (GAUZE/BANDAGES/DRESSINGS) ×2 IMPLANT
BNDG ELASTIC 6X5.8 VLCR STR LF (GAUZE/BANDAGES/DRESSINGS) ×3 IMPLANT
BNDG ESMARK 6X9 LF (GAUZE/BANDAGES/DRESSINGS)
CHLORAPREP W/TINT 26 (MISCELLANEOUS) ×3 IMPLANT
CLSR STERI-STRIP ANTIMIC 1/2X4 (GAUZE/BANDAGES/DRESSINGS) ×3 IMPLANT
COOLER ICEMAN CLASSIC (MISCELLANEOUS) ×3 IMPLANT
COVER BACK TABLE 60X90IN (DRAPES) ×3 IMPLANT
CUFF TOURN SGL QUICK 34 (TOURNIQUET CUFF)
CUFF TRNQT CYL 34X4.125X (TOURNIQUET CUFF) ×2 IMPLANT
DISSECTOR 3.5MM X 13CM CVD (MISCELLANEOUS) IMPLANT
DISSECTOR 4.0MMX13CM CVD (MISCELLANEOUS) ×3 IMPLANT
DRAPE ARTHROSCOPY W/POUCH 90 (DRAPES) ×2 IMPLANT
DRAPE IMP U-DRAPE 54X76 (DRAPES) ×2 IMPLANT
DRAPE TOP ARMCOVERS (MISCELLANEOUS) ×2 IMPLANT
DRAPE U-SHAPE 47X51 STRL (DRAPES) ×3 IMPLANT
DRAPE-T ARTHROSCOPY W/POUCH (DRAPES) ×1 IMPLANT
ELECT REM PT RETURN 9FT ADLT (ELECTROSURGICAL) ×3
ELECTRODE REM PT RTRN 9FT ADLT (ELECTROSURGICAL) IMPLANT
GAUZE SPONGE 4X4 12PLY STRL (GAUZE/BANDAGES/DRESSINGS) ×6 IMPLANT
GLOVE SRG 8 PF TXTR STRL LF DI (GLOVE) ×2 IMPLANT
GLOVE SURG ENC MOIS LTX SZ6.5 (GLOVE) ×4 IMPLANT
GLOVE SURG LTX SZ8 (GLOVE) ×6 IMPLANT
GLOVE SURG UNDER POLY LF SZ6.5 (GLOVE) ×3 IMPLANT
GLOVE SURG UNDER POLY LF SZ7 (GLOVE) ×2 IMPLANT
GLOVE SURG UNDER POLY LF SZ8 (GLOVE) ×3
GOWN STRL REUS W/ TWL LRG LVL3 (GOWN DISPOSABLE) ×4 IMPLANT
GOWN STRL REUS W/ TWL XL LVL3 (GOWN DISPOSABLE) ×2 IMPLANT
GOWN STRL REUS W/TWL LRG LVL3 (GOWN DISPOSABLE) ×6
GOWN STRL REUS W/TWL XL LVL3 (GOWN DISPOSABLE) ×3 IMPLANT
GRAFT TISS LT MED FEM HEMI (Tissue) IMPLANT
GRAFT TISSUE HEMIFEM CONDYL MD (Tissue) ×2 IMPLANT
GRFT TISSUE HEMIFEM CONDYLE MD (Tissue) ×3 IMPLANT
HANDPIECE INTERPULSE COAX TIP (DISPOSABLE) ×3
IMMOBILIZER KNEE 22 UNIV (SOFTGOODS) ×1 IMPLANT
IMMOBILIZER KNEE 24 THIGH 36 (MISCELLANEOUS) IMPLANT
IMMOBILIZER KNEE 24 UNIV (MISCELLANEOUS)
IV NS IRRIG 3000ML ARTHROMATIC (IV SOLUTION) ×2 IMPLANT
KIT BIOUNI DISP W/DRILL BITS (KITS) ×1 IMPLANT
KIT BIOUNI OBLONG SZ L14 (KITS) ×1 IMPLANT
KIT LEG STABILIZATION (KITS) ×2 IMPLANT
KIT TURNOVER KIT B (KITS) ×3 IMPLANT
MANIFOLD NEPTUNE II (INSTRUMENTS) ×3 IMPLANT
NS IRRIG 1000ML POUR BTL (IV SOLUTION) ×1 IMPLANT
PACK ARTHROSCOPY DSU (CUSTOM PROCEDURE TRAY) ×3 IMPLANT
PACK BASIN DAY SURGERY FS (CUSTOM PROCEDURE TRAY) ×3 IMPLANT
PAD COLD SHLDR WRAP-ON (PAD) ×3 IMPLANT
PENCIL SMOKE EVACUATOR (MISCELLANEOUS) ×3 IMPLANT
PORT APPOLLO RF 90DEGREE MULTI (SURGICAL WAND) IMPLANT
SET HNDPC FAN SPRY TIP SCT (DISPOSABLE) ×2 IMPLANT
SHEET MEDIUM DRAPE 40X70 STRL (DRAPES) ×3 IMPLANT
SLEEVE SCD COMPRESS KNEE MED (STOCKING) ×3 IMPLANT
SPONGE T-LAP 18X18 ~~LOC~~+RFID (SPONGE) ×3 IMPLANT
SUT FIBERWIRE #2 38 T-5 BLUE (SUTURE)
SUT MNCRL AB 4-0 PS2 18 (SUTURE) ×3 IMPLANT
SUT VIC AB 0 CT1 27 (SUTURE) ×3
SUT VIC AB 0 CT1 27XBRD ANBCTR (SUTURE) ×2 IMPLANT
SUT VIC AB 0 CT1 27XCR 8 STRN (SUTURE) IMPLANT
SUT VIC AB 2-0 SH 27 (SUTURE)
SUT VIC AB 2-0 SH 27XBRD (SUTURE) IMPLANT
SUT VIC AB 3-0 SH 27 (SUTURE) ×3
SUT VIC AB 3-0 SH 27X BRD (SUTURE) ×2 IMPLANT
SUTURE FIBERWR #2 38 T-5 BLUE (SUTURE) IMPLANT
TOWEL GREEN STERILE FF (TOWEL DISPOSABLE) ×5 IMPLANT
TUBE CONNECTING 20X1/4 (TUBING) ×3 IMPLANT
TUBE SUCTION HIGH CAP CLEAR NV (SUCTIONS) ×3 IMPLANT
TUBING ARTHROSCOPY IRRIG 16FT (MISCELLANEOUS) ×3 IMPLANT
WATER STERILE IRR 1000ML POUR (IV SOLUTION) ×2 IMPLANT
WRAP KNEE MAXI GEL POST OP (GAUZE/BANDAGES/DRESSINGS) IMPLANT

## 2021-09-10 NOTE — Anesthesia Procedure Notes (Signed)
Procedure Name: LMA Insertion Date/Time: 09/10/2021 8:30 AM Performed by: Lauralyn Primes, CRNA Pre-anesthesia Checklist: Patient identified, Emergency Drugs available, Suction available and Patient being monitored Patient Re-evaluated:Patient Re-evaluated prior to induction Oxygen Delivery Method: Circle system utilized Preoxygenation: Pre-oxygenation with 100% oxygen Induction Type: IV induction Ventilation: Mask ventilation without difficulty LMA: LMA inserted LMA Size: 5.0 Number of attempts: 1 Airway Equipment and Method: Bite block Placement Confirmation: positive ETCO2 Tube secured with: Tape Dental Injury: Teeth and Oropharynx as per pre-operative assessment

## 2021-09-10 NOTE — Op Note (Signed)
Orthopaedic Surgery Operative Note (CSN: [id number])  Walter Roberson  09/17/1986 Date of Surgery: 09/10/2021   Diagnoses:  Left knee medial femoral condyle osteochondral defect and loose bodies  Procedure: Left knee arthroscopy with loose body excision and chondroplasty Left osteochondral allograft, fresh   Operative Finding Exam under anesthesia: Full motion no limitation no instability Suprapatellar pouch: Multiple small loose bodies floating within the fluid. Patellofemoral Compartment: Normal Medial Compartment: Redemonstration of the large osteochondral lesion full-thickness with some consolidation of the area lateral to the lesion that had previously been microfractured. Lateral Compartment: Normal Intercondylar Notch: Normal  Successful completion of the planned procedure.  Patient had a large osteochondral lesion and had a routine osteochondral allograft after loose body removal.  We had good fit and fixation with the bio-uni kit from [employer].  PRP used for augmentation biologically.  Post-operative plan: The patient will be nonweightbearing for 6 weeks using a CPM advancing from 30 degrees to 90 as tolerated.  He will use a knee immobilizer at night for the first week..  The patient will be discharged home.  DVT prophylaxis Aspirin 81 mg twice daily for 6 weeks.  Pain control with PRN pain medication preferring oral medicines.  Follow up plan will be scheduled in approximately 7 days for incision check and XR.  Post-Op Diagnosis: Same Surgeons:Primary: Walter Gash, MD Assistants:Walter McBane PA-C [facility] Anesthesia: General with local anesthetic Antibiotics: Ancef 2 g with local vancomycin powder 1 g at the surgical site Tourniquet time:  Total Tourniquet Time Documented: Thigh (Left) - 91 minutes Total: Thigh (Left) - 91 minutes  Estimated Blood Loss: Minimal Complications: None Specimens: None Implants: Implant Name Type Inv. Item Serial No.  Manufacturer Lot No. LRB No. Used Action  GRFT TISSUE HEMIFEM CONDYLE MD - [id number] Tissue GRFT TISSUE HEMIFEM CONDYLE MD [id number] [place] [id number] Left 1 Implanted    Indications for Surgery:   Walter Roberson is a 35 y.o. male with full-thickness osteochondral defect on the medial femoral condyle refractory to previous microfracture by another surgeon and loose body excision with scope to identify possible options was completed prior to proceeding today.  Benefits and risks of operative and nonoperative management were discussed prior to surgery with patient/guardian(s) and informed consent form was completed.  Specific risks including infection, need for additional surgery, graft rejection, nonunion, need for revision amongst others.   Procedure:   The patient was identified properly. Informed consent was obtained and the surgical site was marked. The patient was taken up to suite where general anesthesia was induced. The patient was placed in the supine position with a post against the surgical leg and a nonsterile tourniquet applied. The surgical leg was then prepped and draped usual sterile fashion.  A standard surgical timeout was performed.  2 standard anterior portals were made and diagnostic arthroscopy performed. Please note the findings as noted above.  We identified multiple loose bodies within the joint and remove these using a shaver and a basket.  We cleared the joint to ensure that there is no loose bodies.  At this point we identified the medial femoral condyle lesion which we performed a chondroplasty on.  We then withdrew our instruments and made a medial parapatellar approach about 6 cm in length.  With the skin sharp achieving hemostasis we progressed.  We identified the retinaculum and opened along the thigh of the patella.  We were able to clear fat pad while preserving the medial meniscus.  We  were careful to preserve the anterior root.  That point placed Z  retractors as well as its Hohmann in the notch.  Were able to identify the lesion about 90 to 105 degrees of flexion.  We identified that it was appropriate for a bio-Uni instrumentation.  We selected a large 14 mm size.  We initially marked its position over the condyle and ensure that we had appropriate position.  We then used a fresh matched osteochondral allograft that was prepared on the back table.  We used the instrumentation to cut a 14 large graft taking care to preserve the graft to ensure that it did not fall the ground.  In the meantime 15 cc of whole blood was used to prepare and spin down to about 5 cc of PRP.  Once the graft was harvested and appropriately sized we chamfered the edges and used the pulse lavage with 3 L of normal saline to remove any biologic material.  The graft was carefully placed in the PRP to soak.  We then turned our attention to the femur.  We initially scored the articular cartilage down to the subchondral bone and then placed 2 guidewires.  These guidewires were used to ream with a stop block anterior and posterior halves of our lesion to prepare for the recipient socket.  Once the socket was created we were able to clear any intercalary tissue and bone and this was all safe for eventual bone grafting if necessary.  We should ensure that the lesion itself was clear of any loose fragments and irrigated copiously.  We then placed a dilator to size and check her overall fit.  Fit was near anatomic.  We then took our graft and were able to get press-fit and secure fixation without need of screws.  The graft was very stable through range of motion and did not have any wobbling or any Rocking Horse type maneuvering.  We carefully irrigated again and closed the capsule in a multilayer fashion with #1 Vicryl.  This was a watertight closure.  We then closed the skin in a multilayer fashion with absorbable suture.  Local anesthetic was infiltrated.  Steri-Strips were  placed.  Bulky dressing was placed.  Patient was placed in a knee immobilizer.  The patient was awoken from general anesthesia and taken to the PACU in stable condition without complication.   Walter McBane, PA-C, present and scrubbed throughout the case, critical for completion in a timely fashion, and for retraction, instrumentation, closure.   

## 2021-09-10 NOTE — Interval H&P Note (Signed)
All questions answered, patient wants to proceed with procedure. ? ?

## 2021-09-10 NOTE — Transfer of Care (Signed)
Immediate Anesthesia Transfer of Care Note  Patient: Walter Roberson  Procedure(s) Performed: OSTEOCHONDRAL DEFECT REPAIR/RECONSTRUCTION (Left: Knee) CHONDROPLASTY (Left) ARTHROSCOPY KNEE WITH LOOSE BODY EXCISION (Left: Knee)  Patient Location: PACU  Anesthesia Type:General  Level of Consciousness: drowsy  Airway & Oxygen Therapy: Patient Spontanous Breathing and Patient connected to face mask oxygen  Post-op Assessment: Report given to RN and Post -op Vital signs reviewed and stable  Post vital signs: Reviewed and stable  Last Vitals:  Vitals Value Taken Time  BP    Temp    Pulse    Resp    SpO2      Last Pain:  Vitals:   09/10/21 0656  TempSrc: Oral  PainSc: 0-No pain         Complications: No notable events documented.

## 2021-09-10 NOTE — Discharge Instructions (Addendum)
Ramond Marrow MD, MPH Alfonse Alpers, PA-C [facility] [address], [address] [phone] (tel)   [phone] (fax)   POST-OPERATIVE INSTRUCTIONS - KNEE  WOUND CARE - You may remove the Operative Dressing on Post-Op Day #3 (72hrs after surgery).   - Alternatively if you would like you can leave dressing on until follow-up if within 7-8 days but keep it dry. - Leave steri-strips in place until they fall off on their own, usually 2 weeks postop. - An ACE wrap may be used to control swelling, do not wrap this too tight.  If the initial ACE wrap feels too tight you may loosen it. - There may be a small amount of fluid/bleeding leaking at the surgical site.  - This is normal; the knee is filled with fluid during the procedure and can leak for 24-48hrs after surgery.  - You may change/reinforce the bandage as needed.  - Use the Cryocuff or Ice as often as possible for the first 7 days, then as needed for pain relief. Always keep a towel, ACE wrap or other barrier between the cooling unit and your skin.  - You may shower on Post-Op Day #3. Gently pat the area dry. Do not soak the knee in water or submerge it.  - Do not go swimming in the pool or ocean until 4 weeks after surgery or when otherwise instructed.  Keep incisions as dry as possible.   BRACE/AMBULATION - You will be placed in a brace post-operatively.  - Wear your brace at night time and for comfort during the day until your follow-up appointment - You may remove for hygiene. -           Use crutches or a walker to help you ambulate -           Touch-down weight bearing: when you stand or walk, you may only touch your foot to the floor for balance -           Do NOT put any body weight on your leg   REGIONAL ANESTHESIA (NERVE BLOCKS) - The anesthesia team may have performed a nerve block for you if safe in the setting of your care.  This is a great tool used to minimize pain.  Typically the block may start  wearing off overnight.  This can be a challenging period but please utilize your as needed pain medications to try and manage this period and know it will be a brief transition as the nerve block wears completely   POST-OP MEDICATIONS - Multimodal approach to pain control - In general your pain will be controlled with a combination of substances.  Prescriptions unless otherwise discussed are electronically sent to your pharmacy.  This is a carefully made plan we use to minimize narcotic use.     - Diclofenac- Anti-inflammatory medication taken on a scheduled basis - Acetaminophen - Non-narcotic pain medicine taken on a scheduled basis  - Oxycodone - This is a strong narcotic, to be used only on an "as needed" basis for SEVERE pain. - Robaxin - this is a muscle relaxer, take as needed for muscle spasms - Aspirin 81mg  - This medicine is used to minimize the risk of blood clots after surgery. - Zofran - take as needed for nausea  FOLLOW-UP   Please call the office to schedule a follow-up appointment for your incision check, 7-10 days post-operatively.  IF YOU HAVE ANY QUESTIONS, PLEASE FEEL FREE TO CALL OUR OFFICE.   HELPFUL  INFORMATION  - If you had a block, it will wear off between 8-24 hrs postop typically.  This is period when your pain may go from nearly zero to the pain you would have had post-op without the block.  This is an abrupt transition but nothing dangerous is happening.  You may take an extra dose of narcotic when this happens.   Keep your leg elevated to decrease swelling, which will then in turn decrease your pain. I would elevate the foot of your bed by putting a couple of couch pillows between your mattress and box spring. I would not keep pillow directly under your ankle.  - Do not sleep with a pillow behind your knee even if it is more comfortable as this may make it harder to get your knee fully straight long term.   There will be MORE swelling on days 1-3 than there  is on the day of surgery.  This also is normal. The swelling will decrease with the anti-inflammatory medication, ice and keeping it elevated. The swelling will make it more difficult to bend your knee. As the swelling goes down your motion will become easier   You may develop swelling and bruising that extends from your knee down to your calf and perhaps even to your foot over the next week. Do not be alarmed. This too is normal, and it is due to gravity   There may be some numbness adjacent to the incision site. This may last for 6-12 months or longer in some patients and is expected.   You may return to sedentary work/school in the next couple of days when you feel up to it. You will need to keep your leg elevated as much as possible    You should wean off your narcotic medicines as soon as you are able.  Most patients will be off or using minimal narcotics before their first postop appointment.    We suggest you use the pain medication the first night prior to going to bed, in order to ease any pain when the anesthesia wears off. You should avoid taking pain medications on an empty stomach as it will make you nauseous.   Do not drink alcoholic beverages or take illicit drugs when taking pain medications.   It is against the law to drive while taking narcotics. You cannot drive if your Right leg is in brace locked in extension.   Pain medication may make you constipated.  Below are a few solutions to try in this order:  o Decrease the amount of pain medication if you aren't having pain.  o Drink lots of decaffeinated fluids.  o Drink prune juice and/or each dried prunes   o If the first 3 don't work start with additional solutions  o Take Colace - an over-the-counter stool softener  o Take Senokot - an over-the-counter laxative  o Take Miralax - a stronger over-the-counter laxative   For more information including helpful videos and documents visit our website:    [email or web address]     Post Anesthesia Home Care Instructions  Activity: Get plenty of rest for the remainder of the day. A responsible individual must stay with you for 24 hours following the procedure.  For the next 24 hours, DO NOT: -Drive a car -Advertising copywriter -Drink alcoholic beverages -Take any medication unless instructed by your physician -Make any legal decisions or sign important papers.  Meals: Start with liquid foods such as gelatin or soup. Progress to regular foods as  tolerated. Avoid greasy, spicy, heavy foods. If nausea and/or vomiting occur, drink only clear liquids until the nausea and/or vomiting subsides. Call your physician if vomiting continues.  Special Instructions/Symptoms: Your throat may feel dry or sore from the anesthesia or the breathing tube placed in your throat during surgery. If this causes discomfort, gargle with warm salt water. The discomfort should disappear within 24 hours.  If you had a scopolamine patch placed behind your ear for the management of post- operative nausea and/or vomiting:  1. The medication in the patch is effective for 72 hours, after which it should be removed.  Wrap patch in a tissue and discard in the trash. Wash hands thoroughly with soap and water. 2. You may remove the patch earlier than 72 hours if you experience unpleasant side effects which may include dry mouth, dizziness or visual disturbances. 3. Avoid touching the patch. Wash your hands with soap and water after contact with the patch.

## 2021-09-10 NOTE — Anesthesia Procedure Notes (Signed)
Procedure Name: LMA Insertion Date/Time: 09/10/2021 9:44 AM Performed by: Lauralyn Primes, CRNA Pre-anesthesia Checklist: Patient identified, Emergency Drugs available, Suction available and Patient being monitored Patient Re-evaluated:Patient Re-evaluated prior to induction Oxygen Delivery Method: Circle system utilized Preoxygenation: Pre-oxygenation with 100% oxygen Induction Type: IV induction Ventilation: Mask ventilation without difficulty LMA: LMA with gastric port inserted LMA Size: 5.0 Number of attempts: 1 Airway Equipment and Method: Bite block Placement Confirmation: positive ETCO2 Tube secured with: Tape Dental Injury: Teeth and Oropharynx as per pre-operative assessment

## 2021-09-10 NOTE — Anesthesia Postprocedure Evaluation (Signed)
Anesthesia Post Note  Patient: Walter Roberson  Procedure(s) Performed: OSTEOCHONDRAL DEFECT REPAIR/RECONSTRUCTION (Left: Knee) CHONDROPLASTY (Left) ARTHROSCOPY KNEE WITH LOOSE BODY EXCISION (Left: Knee)     Patient location during evaluation: Phase II Anesthesia Type: General Level of consciousness: awake Pain management: pain level controlled Vital Signs Assessment: post-procedure vital signs reviewed and stable Respiratory status: spontaneous breathing Cardiovascular status: stable Postop Assessment: no apparent nausea or vomiting Anesthetic complications: no   No notable events documented.  Last Vitals:  Vitals:   09/10/21 1115 09/10/21 1145  BP: 122/78 117/88  Pulse: 98 (!) 102  Resp: 16 16  Temp:  (!) 36.4 C  SpO2: 99% 95%    Last Pain:  Vitals:   09/10/21 1148  TempSrc:   PainSc: 6                  John F [address]

## 2021-09-12 ENCOUNTER — Encounter (HOSPITAL_BASED_OUTPATIENT_CLINIC_OR_DEPARTMENT_OTHER): Payer: Self-pay | Admitting: Orthopaedic Surgery

## 2021-09-15 DIAGNOSIS — S83209A Unspecified tear of unspecified meniscus, current injury, unspecified knee, initial encounter: Secondary | ICD-10-CM | POA: Diagnosis not present

## 2021-09-15 DIAGNOSIS — M2242 Chondromalacia patellae, left knee: Secondary | ICD-10-CM | POA: Diagnosis not present

## 2021-09-17 DIAGNOSIS — R2689 Other abnormalities of gait and mobility: Secondary | ICD-10-CM | POA: Diagnosis not present

## 2021-09-17 DIAGNOSIS — M25562 Pain in left knee: Secondary | ICD-10-CM | POA: Diagnosis not present

## 2021-09-17 DIAGNOSIS — M6281 Muscle weakness (generalized): Secondary | ICD-10-CM | POA: Diagnosis not present

## 2021-09-18 DIAGNOSIS — M25562 Pain in left knee: Secondary | ICD-10-CM | POA: Diagnosis not present

## 2021-09-23 ENCOUNTER — Encounter: Payer: Self-pay | Admitting: Internal Medicine

## 2021-09-24 ENCOUNTER — Other Ambulatory Visit: Payer: Self-pay | Admitting: Internal Medicine

## 2021-09-24 DIAGNOSIS — F900 Attention-deficit hyperactivity disorder, predominantly inattentive type: Secondary | ICD-10-CM

## 2021-09-24 MED ORDER — AMPHETAMINE-DEXTROAMPHET ER 30 MG PO CP24: 30.0000 mg | ORAL_CAPSULE | ORAL | 0 refills | Status: DC

## 2021-09-25 ENCOUNTER — Ambulatory Visit: Payer: BC Managed Care – PPO | Admitting: Internal Medicine

## 2021-09-25 ENCOUNTER — Encounter: Payer: Self-pay | Admitting: Internal Medicine

## 2021-09-25 ENCOUNTER — Telehealth: Payer: BC Managed Care – PPO | Admitting: Internal Medicine

## 2021-09-25 VITALS — BP 180/100 | HR 121 | Temp 98.1°F | Ht 68.0 in | Wt 216.5 lb

## 2021-09-25 DIAGNOSIS — I10 Essential (primary) hypertension: Secondary | ICD-10-CM | POA: Diagnosis not present

## 2021-09-25 DIAGNOSIS — Z23 Encounter for immunization: Secondary | ICD-10-CM

## 2021-09-25 DIAGNOSIS — F900 Attention-deficit hyperactivity disorder, predominantly inattentive type: Secondary | ICD-10-CM

## 2021-09-25 MED ORDER — AMPHETAMINE-DEXTROAMPHET ER 30 MG PO CP24: 30.0000 mg | ORAL_CAPSULE | ORAL | 0 refills | Status: DC

## 2021-09-25 NOTE — Progress Notes (Signed)
Established Patient Office Visit     This visit occurred during the SARS-CoV-2 public health emergency.  Safety protocols were in place, including screening questions prior to the visit, additional usage of staff PPE, and extensive cleaning of exam room while observing appropriate contact time as indicated for disinfecting solutions.    CC/Reason for Visit: Medication refills  HPI: Walter Roberson is a 35 y.o. male who is coming in today for the above mentioned reasons. Past Medical History is significant for: Hypertension and ADHD.  He recently had a complicated left knee surgery and will be nonweightbearing for at least 6 weeks.  He has been taking his blood pressure routinely at home and it has been normal.  In office 2 separate measurements are 180/100 and 160/100.  He is due for Adderall refills.   Past Medical/Surgical History: Past Medical History:  Diagnosis Date   ADHD    HTN (hypertension)    Overweight (BMI 25.0-29.9)     Past Surgical History:  Procedure Laterality Date   CARPAL TUNNEL RELEASE Right    CHONDROPLASTY Left 09/10/2021   Procedure: CHONDROPLASTY;  Surgeon: Bjorn Pippin, MD;  Location: [facility];  Service: Orthopedics;  Laterality: Left;   FINGER CLOSED REDUCTION     KNEE ARTHROSCOPY Left 09/10/2021   Procedure: ARTHROSCOPY KNEE WITH LOOSE BODY EXCISION;  Surgeon: Bjorn Pippin, MD;  Location: [facility];  Service: Orthopedics;  Laterality: Left;   KNEE ARTHROSCOPY WITH OSTEOCHONDRAL DEFECT REPAIR Left 03/01/2019   Procedure: LEFT KNEE ARTHROSCOPY WITH OSTEOCHARAL DEFECT FIXATION MICROFRACTURE;  Surgeon: Dannielle Huh, MD;  Location:  SURGERY CENTER;  Service: Orthopedics;  Laterality: Left;   ORIF ANKLE FRACTURE BIMALLEOLAR Left    x2   OSTEOCHONDRAL DEFECT REPAIR/RECONSTRUCTION Left 09/10/2021   Procedure: OSTEOCHONDRAL DEFECT REPAIR/RECONSTRUCTION;  Surgeon: Bjorn Pippin, MD;  Location:  SURGERY CENTER;   Service: Orthopedics;  Laterality: Left;   REPAIR ANKLE LIGAMENT Left     Social History:  reports that he has never smoked. He has never used smokeless tobacco. He reports current alcohol use of about 4.0 standard drinks per week. He reports that he does not use drugs.  Allergies: No Known Allergies  Family History:  Family History  Problem Relation Age of Onset   Hypertension Father    Diabetes Brother      Current Outpatient Medications:    aspirin (ASPIRIN CHILDRENS) 81 MG chewable tablet, Chew 1 tablet (81 mg total) by mouth 2 (two) times daily. For DVT prophylaxis after surgery, Disp: 84 tablet, Rfl: 0   diclofenac (VOLTAREN) 75 MG EC tablet, Take 1 tablet (75 mg total) by mouth 2 (two) times daily., Disp: 60 tablet, Rfl: 0   hydrochlorothiazide (HYDRODIURIL) 25 MG tablet, TAKE 1 TABLET BY MOUTH EVERY DAY, Disp: 90 tablet, Rfl: 1   lisinopril (ZESTRIL) 10 MG tablet, TAKE 1 TABLET BY MOUTH EVERY DAY, Disp: 90 tablet, Rfl: 1   methocarbamol (ROBAXIN) 500 MG tablet, Take 1 tablet (500 mg total) by mouth every 8 (eight) hours as needed for muscle spasms., Disp: 30 tablet, Rfl: 0   amphetamine-dextroamphetamine (ADDERALL XR) 30 MG 24 hr capsule, Take 1 capsule (30 mg total) by mouth every morning., Disp: 30 capsule, Rfl: 0   amphetamine-dextroamphetamine (ADDERALL XR) 30 MG 24 hr capsule, Take 1 capsule (30 mg total) by mouth every morning., Disp: 30 capsule, Rfl: 0   amphetamine-dextroamphetamine (ADDERALL XR) 30 MG 24 hr capsule, Take 1 capsule (30  mg total) by mouth every morning., Disp: 30 capsule, Rfl: 0  Review of Systems:  Constitutional: Denies fever, chills, diaphoresis, appetite change and fatigue.  HEENT: Denies photophobia, eye pain, redness, hearing loss, ear pain, congestion, sore throat, rhinorrhea, sneezing, mouth sores, trouble swallowing, neck pain, neck stiffness and tinnitus.   Respiratory: Denies SOB, DOE, cough, chest tightness,  and wheezing.   Cardiovascular:  Denies chest pain, palpitations and leg swelling.  Gastrointestinal: Denies nausea, vomiting, abdominal pain, diarrhea, constipation, blood in stool and abdominal distention.  Genitourinary: Denies dysuria, urgency, frequency, hematuria, flank pain and difficulty urinating.  Endocrine: Denies: hot or cold intolerance, sweats, changes in hair or nails, polyuria, polydipsia. Musculoskeletal: Denies myalgias, back pain, joint swelling, arthralgias and gait problem.  Skin: Denies pallor, rash and wound.  Neurological: Denies dizziness, seizures, syncope, weakness, light-headedness, numbness and headaches.  Hematological: Denies adenopathy. Easy bruising, personal or family bleeding history  Psychiatric/Behavioral: Denies suicidal ideation, mood changes, confusion, nervousness, sleep disturbance and agitation    Physical Exam: Vitals:   09/25/21 1503  BP: (!) 180/100  Pulse: (!) 121  Temp: 98.1 F (36.7 C)  TempSrc: Oral  SpO2: 99%  Weight: 216 lb 8 oz (98.2 kg)  Height: 5\' 8"  (1.727 m)    Body mass index is 32.92 kg/m.   Constitutional: NAD, calm, comfortable on crutches. Eyes: PERRL, lids and conjunctivae normal ENMT: Mucous membranes are moist.  Respiratory: clear to auscultation bilaterally, no wheezing, no crackles. Normal respiratory effort. No accessory muscle use.  Cardiovascular: Regular rate and rhythm, no murmurs / rubs / gallops. No extremity edema.  Psychiatric: Normal judgment and insight. Alert and oriented x 3. Normal mood.    Impression and Plan:  Attention deficit hyperactivity disorder (ADHD), predominantly inattentive type  -PDMP reviewed, no red flags, overdose risk or is 200.  Suspect increase in ORS related to oxycodone received for his knee surgery. -Refill Adderall 30 mg XR to take 1 tablet daily for total of 30 tablets a month x3 months.  Needs flu shot  - Plan: Flu Vaccine QUAD 6+ mos PF IM (Fluarix Quad PF)  Primary hypertension -Blood pressure is  quite elevated today.  As his home measurements have been normal and his recent office visits have also been within normal limits, I will not make any changes today, he has been advised to do daily ambulatory blood pressure measurements and notify us if it remains elevated. -He does note that he had a red bull today.  Time spent: 31 minutes reviewing chart, interviewing and examining patient and formulating plan of care.    Lelon Frohlich, MD [facility]

## 2021-09-26 DIAGNOSIS — M6281 Muscle weakness (generalized): Secondary | ICD-10-CM | POA: Diagnosis not present

## 2021-09-26 DIAGNOSIS — R2689 Other abnormalities of gait and mobility: Secondary | ICD-10-CM | POA: Diagnosis not present

## 2021-09-26 DIAGNOSIS — M25562 Pain in left knee: Secondary | ICD-10-CM | POA: Diagnosis not present

## 2021-10-06 ENCOUNTER — Other Ambulatory Visit: Payer: Self-pay | Admitting: Internal Medicine

## 2021-10-06 DIAGNOSIS — I10 Essential (primary) hypertension: Secondary | ICD-10-CM

## 2021-10-23 DIAGNOSIS — M25562 Pain in left knee: Secondary | ICD-10-CM | POA: Diagnosis not present

## 2021-10-27 DIAGNOSIS — M25562 Pain in left knee: Secondary | ICD-10-CM | POA: Diagnosis not present

## 2021-10-27 DIAGNOSIS — M6281 Muscle weakness (generalized): Secondary | ICD-10-CM | POA: Diagnosis not present

## 2021-10-27 DIAGNOSIS — R2689 Other abnormalities of gait and mobility: Secondary | ICD-10-CM | POA: Diagnosis not present

## 2021-10-31 DIAGNOSIS — M6281 Muscle weakness (generalized): Secondary | ICD-10-CM | POA: Diagnosis not present

## 2021-10-31 DIAGNOSIS — R2689 Other abnormalities of gait and mobility: Secondary | ICD-10-CM | POA: Diagnosis not present

## 2021-10-31 DIAGNOSIS — M25562 Pain in left knee: Secondary | ICD-10-CM | POA: Diagnosis not present

## 2021-11-05 DIAGNOSIS — M25562 Pain in left knee: Secondary | ICD-10-CM | POA: Diagnosis not present

## 2021-11-05 DIAGNOSIS — R2689 Other abnormalities of gait and mobility: Secondary | ICD-10-CM | POA: Diagnosis not present

## 2021-11-05 DIAGNOSIS — M6281 Muscle weakness (generalized): Secondary | ICD-10-CM | POA: Diagnosis not present

## 2021-11-07 DIAGNOSIS — M6281 Muscle weakness (generalized): Secondary | ICD-10-CM | POA: Diagnosis not present

## 2021-11-07 DIAGNOSIS — R2689 Other abnormalities of gait and mobility: Secondary | ICD-10-CM | POA: Diagnosis not present

## 2021-11-07 DIAGNOSIS — M25562 Pain in left knee: Secondary | ICD-10-CM | POA: Diagnosis not present

## 2021-11-11 DIAGNOSIS — R2689 Other abnormalities of gait and mobility: Secondary | ICD-10-CM | POA: Diagnosis not present

## 2021-11-11 DIAGNOSIS — M25562 Pain in left knee: Secondary | ICD-10-CM | POA: Diagnosis not present

## 2021-11-11 DIAGNOSIS — M6281 Muscle weakness (generalized): Secondary | ICD-10-CM | POA: Diagnosis not present

## 2021-11-13 DIAGNOSIS — M6281 Muscle weakness (generalized): Secondary | ICD-10-CM | POA: Diagnosis not present

## 2021-11-13 DIAGNOSIS — R2689 Other abnormalities of gait and mobility: Secondary | ICD-10-CM | POA: Diagnosis not present

## 2021-11-13 DIAGNOSIS — M25562 Pain in left knee: Secondary | ICD-10-CM | POA: Diagnosis not present

## 2021-11-24 ENCOUNTER — Telehealth: Payer: Self-pay | Admitting: Internal Medicine

## 2021-11-24 DIAGNOSIS — F900 Attention-deficit hyperactivity disorder, predominantly inattentive type: Secondary | ICD-10-CM

## 2021-11-24 MED ORDER — AMPHETAMINE-DEXTROAMPHET ER 30 MG PO CP24: 30.0000 mg | ORAL_CAPSULE | ORAL | 0 refills | Status: DC

## 2021-11-24 NOTE — Telephone Encounter (Signed)
Noted! Rx pending

## 2021-11-24 NOTE — Telephone Encounter (Incomplete Revision)
Pt is calling and [facility] not [facility] adderall in stock and pt would like new rx amphetamine-dextroamphetamine (ADDERALL XR) 30 MG 24 hr capsule send to  [facility], [place] [facility] [address] Phone:  [phone]  Fax:  [phone]

## 2021-11-24 NOTE — Telephone Encounter (Signed)
Please advise 

## 2021-11-24 NOTE — Telephone Encounter (Addendum)
Pt is calling and [facility] not [facility] adderall in stock and pt would like new rx amphetamine-dextroamphetamine (ADDERALL XR) 30 MG 24 hr capsule send to  °[id number] - [place], [place] - [address] AT [address] Phone:  [phone]  °Fax:  [phone]  °  ° °

## 2021-11-25 ENCOUNTER — Encounter: Payer: Self-pay | Admitting: Internal Medicine

## 2021-11-27 DIAGNOSIS — M6281 Muscle weakness (generalized): Secondary | ICD-10-CM | POA: Diagnosis not present

## 2021-11-27 DIAGNOSIS — R2689 Other abnormalities of gait and mobility: Secondary | ICD-10-CM | POA: Diagnosis not present

## 2021-11-27 DIAGNOSIS — M25562 Pain in left knee: Secondary | ICD-10-CM | POA: Diagnosis not present

## 2021-12-01 DIAGNOSIS — J029 Acute pharyngitis, unspecified: Secondary | ICD-10-CM | POA: Diagnosis not present

## 2021-12-01 DIAGNOSIS — R1110 Vomiting, unspecified: Secondary | ICD-10-CM | POA: Diagnosis not present

## 2021-12-01 DIAGNOSIS — R0981 Nasal congestion: Secondary | ICD-10-CM | POA: Diagnosis not present

## 2021-12-01 DIAGNOSIS — R197 Diarrhea, unspecified: Secondary | ICD-10-CM | POA: Diagnosis not present

## 2021-12-03 ENCOUNTER — Other Ambulatory Visit: Payer: Self-pay | Admitting: Internal Medicine

## 2021-12-03 DIAGNOSIS — I10 Essential (primary) hypertension: Secondary | ICD-10-CM

## 2021-12-05 DIAGNOSIS — M25562 Pain in left knee: Secondary | ICD-10-CM | POA: Diagnosis not present

## 2021-12-08 DIAGNOSIS — M25562 Pain in left knee: Secondary | ICD-10-CM | POA: Diagnosis not present

## 2021-12-08 DIAGNOSIS — M6281 Muscle weakness (generalized): Secondary | ICD-10-CM | POA: Diagnosis not present

## 2021-12-08 DIAGNOSIS — R2689 Other abnormalities of gait and mobility: Secondary | ICD-10-CM | POA: Diagnosis not present

## 2021-12-11 DIAGNOSIS — R2689 Other abnormalities of gait and mobility: Secondary | ICD-10-CM | POA: Diagnosis not present

## 2021-12-11 DIAGNOSIS — M6281 Muscle weakness (generalized): Secondary | ICD-10-CM | POA: Diagnosis not present

## 2021-12-11 DIAGNOSIS — M25562 Pain in left knee: Secondary | ICD-10-CM | POA: Diagnosis not present

## 2021-12-16 DIAGNOSIS — M25562 Pain in left knee: Secondary | ICD-10-CM | POA: Diagnosis not present

## 2021-12-16 DIAGNOSIS — M6281 Muscle weakness (generalized): Secondary | ICD-10-CM | POA: Diagnosis not present

## 2021-12-16 DIAGNOSIS — R2689 Other abnormalities of gait and mobility: Secondary | ICD-10-CM | POA: Diagnosis not present

## 2021-12-19 DIAGNOSIS — R2689 Other abnormalities of gait and mobility: Secondary | ICD-10-CM | POA: Diagnosis not present

## 2021-12-19 DIAGNOSIS — M25562 Pain in left knee: Secondary | ICD-10-CM | POA: Diagnosis not present

## 2021-12-19 DIAGNOSIS — M6281 Muscle weakness (generalized): Secondary | ICD-10-CM | POA: Diagnosis not present

## 2021-12-29 ENCOUNTER — Encounter: Payer: Self-pay | Admitting: Internal Medicine

## 2021-12-29 DIAGNOSIS — F900 Attention-deficit hyperactivity disorder, predominantly inattentive type: Secondary | ICD-10-CM

## 2021-12-30 MED ORDER — AMPHETAMINE-DEXTROAMPHET ER 30 MG PO CP24: 30.0000 mg | ORAL_CAPSULE | ORAL | 0 refills | Status: DC

## 2022-01-16 DIAGNOSIS — M25562 Pain in left knee: Secondary | ICD-10-CM | POA: Diagnosis not present

## 2022-01-20 DIAGNOSIS — M7918 Myalgia, other site: Secondary | ICD-10-CM | POA: Diagnosis not present

## 2022-01-20 DIAGNOSIS — M79602 Pain in left arm: Secondary | ICD-10-CM | POA: Diagnosis not present

## 2022-01-26 ENCOUNTER — Encounter: Payer: Self-pay | Admitting: Internal Medicine

## 2022-01-26 DIAGNOSIS — F900 Attention-deficit hyperactivity disorder, predominantly inattentive type: Secondary | ICD-10-CM

## 2022-01-26 MED ORDER — AMPHETAMINE-DEXTROAMPHET ER 30 MG PO CP24: 30.0000 mg | ORAL_CAPSULE | ORAL | 0 refills | Status: DC

## 2022-01-26 NOTE — Telephone Encounter (Signed)
Per last note Adderall was sent to [facility] due to [facility] not having it in stock. Pt is now wanting her [facility]s to [facility].  ?

## 2022-01-27 DIAGNOSIS — M7918 Myalgia, other site: Secondary | ICD-10-CM | POA: Diagnosis not present

## 2022-01-27 DIAGNOSIS — M79602 Pain in left arm: Secondary | ICD-10-CM | POA: Diagnosis not present

## 2022-02-25 DIAGNOSIS — M62830 Muscle spasm of back: Secondary | ICD-10-CM | POA: Diagnosis not present

## 2022-02-26 ENCOUNTER — Encounter: Payer: Self-pay | Admitting: Internal Medicine

## 2022-02-26 DIAGNOSIS — M2560 Stiffness of unspecified joint, not elsewhere classified: Secondary | ICD-10-CM | POA: Diagnosis not present

## 2022-02-26 DIAGNOSIS — F900 Attention-deficit hyperactivity disorder, predominantly inattentive type: Secondary | ICD-10-CM

## 2022-02-26 DIAGNOSIS — M5416 Radiculopathy, lumbar region: Secondary | ICD-10-CM | POA: Diagnosis not present

## 2022-02-26 MED ORDER — AMPHETAMINE-DEXTROAMPHET ER 30 MG PO CP24: 30.0000 mg | ORAL_CAPSULE | ORAL | 0 refills | Status: DC

## 2022-03-02 ENCOUNTER — Telehealth (INDEPENDENT_AMBULATORY_CARE_PROVIDER_SITE_OTHER): Payer: BC Managed Care – PPO | Admitting: Internal Medicine

## 2022-03-02 DIAGNOSIS — F900 Attention-deficit hyperactivity disorder, predominantly inattentive type: Secondary | ICD-10-CM | POA: Diagnosis not present

## 2022-03-02 MED ORDER — AMPHETAMINE-DEXTROAMPHET ER 30 MG PO CP24: 30.0000 mg | ORAL_CAPSULE | ORAL | 0 refills | Status: DC

## 2022-03-02 NOTE — Progress Notes (Signed)
? ? ?Virtual Visit via Video Note ? ?I connected with Walter Roberson on 03/02/22 at  3:30 PM EDT by a video enabled telemedicine application and verified that I am speaking with the correct person using two identifiers. ? ?Location patient: home ?Location provider: work office ?Persons participating in the virtual visit: patient, provider ? ?I discussed the limitations of evaluation and management by telemedicine and the availability of in person appointments. The patient expressed understanding and agreed to proceed. ? ? ?HPI: ?This is a scheduled visit for controlled substance refill per protocol.  He has a history of ADHD and is on Adderall XR 30 mg daily.  He is tolerating this well without any untoward side effects.  At last visit he was noted to be hypertensive.  Previous in office visits had been within limit.  He states that his blood pressure is averaging around 120/80 at home. ? ? ?ROS: ?Constitutional: Denies fever, chills, diaphoresis, appetite change and fatigue.  ?HEENT: Denies photophobia, eye pain, redness, hearing loss, ear pain, congestion, sore throat, rhinorrhea, sneezing, mouth sores, trouble swallowing, neck pain, neck stiffness and tinnitus.   ?Respiratory: Denies SOB, DOE, cough, chest tightness,  and wheezing.   ?Cardiovascular: Denies chest pain, palpitations and leg swelling.  ?Gastrointestinal: Denies nausea, vomiting, abdominal pain, diarrhea, constipation, blood in stool and abdominal distention.  ?Genitourinary: Denies dysuria, urgency, frequency, hematuria, flank pain and difficulty urinating.  ?Endocrine: Denies: hot or cold intolerance, sweats, changes in hair or nails, polyuria, polydipsia. ?Musculoskeletal: Denies myalgias, back pain, joint swelling, arthralgias and gait problem.  ?Skin: Denies pallor, rash and wound.  ?Neurological: Denies dizziness, seizures, syncope, weakness, light-headedness, numbness and headaches.  ?Hematological: Denies adenopathy. Easy bruising, personal  or family bleeding history  ?Psychiatric/Behavioral: Denies suicidal ideation, mood changes, confusion, nervousness, sleep disturbance and agitation ? ? ?Past Medical History:  ?Diagnosis Date  ? ADHD   ? HTN (hypertension)   ? Overweight (BMI 25.0-29.9)   ? ? ?Past Surgical History:  ?Procedure Laterality Date  ? CARPAL TUNNEL RELEASE Right   ? CHONDROPLASTY Left 09/10/2021  ? Procedure: CHONDROPLASTY;  Surgeon: Bjorn Pippin, MD;  Location: [facility];  Service: Orthopedics;  Laterality: Left;  ? FINGER CLOSED REDUCTION    ? KNEE ARTHROSCOPY Left 09/10/2021  ? Procedure: ARTHROSCOPY KNEE WITH LOOSE BODY EXCISION;  Surgeon: Bjorn Pippin, MD;  Location: [facility];  Service: Orthopedics;  Laterality: Left;  ? KNEE ARTHROSCOPY WITH OSTEOCHONDRAL DEFECT REPAIR Left 03/01/2019  ? Procedure: LEFT KNEE ARTHROSCOPY WITH OSTEOCHARAL DEFECT FIXATION MICROFRACTURE;  Surgeon: Dannielle Huh, MD;  Location: [facility];  Service: Orthopedics;  Laterality: Left;  ? ORIF ANKLE FRACTURE BIMALLEOLAR Left   ? x2  ? OSTEOCHONDRAL DEFECT REPAIR/RECONSTRUCTION Left 09/10/2021  ? Procedure: OSTEOCHONDRAL DEFECT REPAIR/RECONSTRUCTION;  Surgeon: Bjorn Pippin, MD;  Location:  SURGERY CENTER;  Service: Orthopedics;  Laterality: Left;  ? REPAIR ANKLE LIGAMENT Left   ? ? ?Family History  ?Problem Relation Age of Onset  ? Hypertension Father   ? Diabetes Brother   ? ? ?SOCIAL HX:  ? reports that he has never smoked. He has never used smokeless tobacco. He reports current alcohol use of about 4.0 standard drinks per week. He reports that he does not use drugs. ? ? ?Current Outpatient Medications:  ?  diclofenac (VOLTAREN) 75 MG EC tablet, Take 1 tablet (75 mg total) by mouth 2 (two) times daily., Disp: 60 tablet, Rfl: 0 ?  hydrochlorothiazide (HYDRODIURIL) 25  MG tablet, TAKE 1 TABLET BY MOUTH EVERY DAY, Disp: 90 tablet, Rfl: 1 ?  lisinopril (ZESTRIL) 10 MG tablet, TAKE 1 TABLET BY MOUTH EVERY  DAY, Disp: 90 tablet, Rfl: 1 ?  methocarbamol (ROBAXIN) 500 MG tablet, Take 1 tablet (500 mg total) by mouth every 8 (eight) hours as needed for muscle spasms., Disp: 30 tablet, Rfl: 0 ?  amphetamine-dextroamphetamine (ADDERALL XR) 30 MG 24 hr capsule, Take 1 capsule (30 mg total) by mouth every morning., Disp: 30 capsule, Rfl: 0 ?  amphetamine-dextroamphetamine (ADDERALL XR) 30 MG 24 hr capsule, Take 1 capsule (30 mg total) by mouth every morning., Disp: 30 capsule, Rfl: 0 ?  amphetamine-dextroamphetamine (ADDERALL XR) 30 MG 24 hr capsule, Take 1 capsule (30 mg total) by mouth every morning., Disp: 30 capsule, Rfl: 0 ? ?EXAM:  ? ?VITALS per patient if applicable: None reported ? ?GENERAL: alert, oriented, appears well and in no acute distress ? ?HEENT: atraumatic, conjunttiva clear, no obvious abnormalities on inspection of external nose and ears ? ?NECK: normal movements of the head and neck ? ?LUNGS: on inspection no signs of respiratory distress, breathing rate appears normal, no obvious gross increased work of breathing, gasping or wheezing ? ?CV: no obvious cyanosis ? ?MS: moves all visible extremities without noticeable abnormality ? ?PSYCH/NEURO: pleasant and cooperative, no obvious depression or anxiety, speech and thought processing grossly intact ? ?ASSESSMENT AND PLAN: ? ? ?Attention deficit hyperactivity disorder (ADHD), predominantly inattentive type  ?-PDMP reviewed, no red flags, overdose risk score is 190. ?-Refill Adderall XR 30 mg to take 1 tablet a day for a total of 30 tablets a month x3 months. ?  ?I discussed the assessment and treatment plan with the patient. The patient was provided an opportunity to ask questions and all were answered. The patient agreed with the plan and demonstrated an understanding of the instructions. ?  ?The patient was advised to call back or seek an in-person evaluation if the symptoms worsen or if the condition fails to improve as anticipated. ? ? ? ?Chaya Jan, MD  ?[facility] ? ?

## 2022-03-27 NOTE — Telephone Encounter (Signed)
Last refill per controlled substance database: 02/26/22 Last OV: 09/25/21 Next OV: none scheduled

## 2022-04-23 ENCOUNTER — Other Ambulatory Visit: Payer: Self-pay | Admitting: Internal Medicine

## 2022-04-23 DIAGNOSIS — I10 Essential (primary) hypertension: Secondary | ICD-10-CM

## 2022-04-27 ENCOUNTER — Other Ambulatory Visit: Payer: Self-pay | Admitting: Internal Medicine

## 2022-04-27 DIAGNOSIS — F900 Attention-deficit hyperactivity disorder, predominantly inattentive type: Secondary | ICD-10-CM

## 2022-04-28 ENCOUNTER — Encounter: Payer: Self-pay | Admitting: Internal Medicine

## 2022-04-28 DIAGNOSIS — F900 Attention-deficit hyperactivity disorder, predominantly inattentive type: Secondary | ICD-10-CM

## 2022-04-28 MED ORDER — AMPHETAMINE-DEXTROAMPHET ER 30 MG PO CP24: 30.0000 mg | ORAL_CAPSULE | ORAL | 0 refills | Status: DC

## 2022-04-30 ENCOUNTER — Telehealth: Payer: Self-pay | Admitting: Internal Medicine

## 2022-04-30 NOTE — Telephone Encounter (Signed)
Pt call and stated he want to know if there is something else that can replace the adderall because the pharmacy stated it is on back order and don't know when it will come in.

## 2022-05-28 ENCOUNTER — Other Ambulatory Visit: Payer: Self-pay | Admitting: Internal Medicine

## 2022-05-28 DIAGNOSIS — I10 Essential (primary) hypertension: Secondary | ICD-10-CM

## 2022-05-28 MED ORDER — HYDROCHLOROTHIAZIDE 25 MG PO TABS: 25.0000 mg | ORAL_TABLET | Freq: Every day | ORAL | 0 refills | Status: DC

## 2022-06-02 ENCOUNTER — Encounter: Payer: Self-pay | Admitting: Internal Medicine

## 2022-06-05 ENCOUNTER — Other Ambulatory Visit: Payer: Self-pay | Admitting: Internal Medicine

## 2022-06-05 DIAGNOSIS — I10 Essential (primary) hypertension: Secondary | ICD-10-CM

## 2022-06-08 ENCOUNTER — Other Ambulatory Visit: Payer: Self-pay | Admitting: Internal Medicine

## 2022-06-08 DIAGNOSIS — F900 Attention-deficit hyperactivity disorder, predominantly inattentive type: Secondary | ICD-10-CM

## 2022-06-08 MED ORDER — AMPHETAMINE-DEXTROAMPHET ER 30 MG PO CP24: 30.0000 mg | ORAL_CAPSULE | ORAL | 0 refills | Status: DC

## 2022-06-30 DIAGNOSIS — M25562 Pain in left knee: Secondary | ICD-10-CM | POA: Diagnosis not present

## 2022-07-06 ENCOUNTER — Encounter: Payer: Self-pay | Admitting: Internal Medicine

## 2022-07-08 ENCOUNTER — Telehealth: Payer: BC Managed Care – PPO | Admitting: Internal Medicine

## 2022-07-08 ENCOUNTER — Encounter: Payer: Self-pay | Admitting: Internal Medicine

## 2022-07-08 DIAGNOSIS — F900 Attention-deficit hyperactivity disorder, predominantly inattentive type: Secondary | ICD-10-CM

## 2022-07-08 MED ORDER — AMPHETAMINE-DEXTROAMPHET ER 30 MG PO CP24: 30.0000 mg | ORAL_CAPSULE | ORAL | 0 refills | Status: DC

## 2022-07-08 NOTE — Progress Notes (Signed)
Virtual Visit via Video Note  I connected with Walter Roberson on 07/08/22 at  7:30 AM EDT by a video enabled telemedicine application and verified that I am speaking with the correct person using two identifiers.  Location patient: home Location provider: work office Persons participating in the virtual visit: patient, provider  I discussed the limitations of evaluation and management by telemedicine and the availability of in person appointments. The patient expressed understanding and agreed to proceed.   HPI: This is a scheduled visit for the purpose of medication refills per protocol.  He has a history of ADHD and takes Adderall XR 30 mg daily.  He has been feeling well.  He is having an MRI for his knee and he is under the care of Dr. Everardo Pacific.   ROS: Constitutional: Denies fever, chills, diaphoresis, appetite change and fatigue.  HEENT: Denies photophobia, eye pain, redness, hearing loss, ear pain, congestion, sore throat, rhinorrhea, sneezing, mouth sores, trouble swallowing, neck pain, neck stiffness and tinnitus.   Respiratory: Denies SOB, DOE, cough, chest tightness,  and wheezing.   Cardiovascular: Denies chest pain, palpitations and leg swelling.  Gastrointestinal: Denies nausea, vomiting, abdominal pain, diarrhea, constipation, blood in stool and abdominal distention.  Genitourinary: Denies dysuria, urgency, frequency, hematuria, flank pain and difficulty urinating.  Endocrine: Denies: hot or cold intolerance, sweats, changes in hair or nails, polyuria, polydipsia. Musculoskeletal: Denies myalgias, back pain, joint swelling, arthralgias and gait problem.  Skin: Denies pallor, rash and wound.  Neurological: Denies dizziness, seizures, syncope, weakness, light-headedness, numbness and headaches.  Hematological: Denies adenopathy. Easy bruising, personal or family bleeding history  Psychiatric/Behavioral: Denies suicidal ideation, mood changes, confusion, nervousness, sleep  disturbance and agitation   Past Medical History:  Diagnosis Date   ADHD    HTN (hypertension)    Overweight (BMI 25.0-29.9)     Past Surgical History:  Procedure Laterality Date   CARPAL TUNNEL RELEASE Right    CHONDROPLASTY Left 09/10/2021   Procedure: CHONDROPLASTY;  Surgeon: Bjorn Pippin, MD;  Location: [facility];  Service: Orthopedics;  Laterality: Left;   FINGER CLOSED REDUCTION     KNEE ARTHROSCOPY Left 09/10/2021   Procedure: ARTHROSCOPY KNEE WITH LOOSE BODY EXCISION;  Surgeon: Bjorn Pippin, MD;  Location: [facility];  Service: Orthopedics;  Laterality: Left;   KNEE ARTHROSCOPY WITH OSTEOCHONDRAL DEFECT REPAIR Left 03/01/2019   Procedure: LEFT KNEE ARTHROSCOPY WITH OSTEOCHARAL DEFECT FIXATION MICROFRACTURE;  Surgeon: Dannielle Huh, MD;  Location: [facility];  Service: Orthopedics;  Laterality: Left;   ORIF ANKLE FRACTURE BIMALLEOLAR Left    x2   OSTEOCHONDRAL DEFECT REPAIR/RECONSTRUCTION Left 09/10/2021   Procedure: OSTEOCHONDRAL DEFECT REPAIR/RECONSTRUCTION;  Surgeon: Bjorn Pippin, MD;  Location:  SUR[facility]ERY CENTER;  Service: Orthopedics;  Laterality: Left;   REPAIR ANKLE LIGAMENT Left     Family History  Problem Relation Age of Onset   Hypertension Father    Diabetes Brother     SOCIAL HX:   reports that he has never smoked. He has never used smokeless tobacco. He reports current alcohol use of about 4.0 standard drinks of alcohol per week. He reports that he does not use drugs.   Current Outpatient Medications:    diclofenac (VOLTAREN) 75 MG EC tablet, Take 1 tablet (75 mg total) by mouth 2 (two) times daily., Disp: 60 tablet, Rfl: 0   hydrochlorothiazide (HYDRODIURIL) 25 MG tablet, Take 1 tablet (25 mg total) by mouth daily., Disp: 90 tablet, Rfl: 0  lisinopril (ZESTRIL) 10 MG tablet, TAKE 1 TABLET BY MOUTH EVERY DAY, Disp: 90 tablet, Rfl: 0   methocarbamol (ROBAXIN) 500 MG tablet, Take 1 tablet (500 mg total) by  mouth every 8 (eight) hours as needed for muscle spasms., Disp: 30 tablet, Rfl: 0   amphetamine-dextroamphetamine (ADDERALL XR) 30 MG 24 hr capsule, Take 1 capsule (30 mg total) by mouth every morning., Disp: 30 capsule, Rfl: 0   amphetamine-dextroamphetamine (ADDERALL XR) 30 MG 24 hr capsule, Take 1 capsule (30 mg total) by mouth every morning., Disp: 30 capsule, Rfl: 0   amphetamine-dextroamphetamine (ADDERALL XR) 30 MG 24 hr capsule, Take 1 capsule (30 mg total) by mouth every morning., Disp: 30 capsule, Rfl: 0  EXAM:   VITALS per patient if applicable: None reported  GENERAL: alert, oriented, appears well and in no acute distress  HEENT: atraumatic, conjunttiva clear, no obvious abnormalities on inspection of external nose and ears  NECK: normal movements of the head and neck  LUNGS: on inspection no signs of respiratory distress, breathing rate appears normal, no obvious gross increased work of breathing, gasping or wheezing  CV: no obvious cyanosis  MS: moves all visible extremities without noticeable abnormality  PSYCH/NEURO: pleasant and cooperative, no obvious depression or anxiety, speech and thought processing grossly intact  ASSESSMENT AND PLAN:   Attention deficit hyperactivity disorder (ADHD), predominantly inattentive type -PDMP reviewed, no red flags, overdose risk score is 280. -Refill Adderall XR 30 mg to take 1 tablet a day for total of 30 tablets a month x3 months.     I discussed the assessment and treatment plan with the patient. The patient was provided an opportunity to ask questions and all were answered. The patient agreed with the plan and demonstrated an understanding of the instructions.   The patient was advised to call back or seek an in-person evaluation if the symptoms worsen or if the condition fails to improve as anticipated.    Chaya Jan, MD  [name]

## 2022-07-09 DIAGNOSIS — M25562 Pain in left knee: Secondary | ICD-10-CM | POA: Diagnosis not present

## 2022-07-17 ENCOUNTER — Encounter (HOSPITAL_BASED_OUTPATIENT_CLINIC_OR_DEPARTMENT_OTHER): Payer: Self-pay | Admitting: Orthopaedic Surgery

## 2022-07-17 ENCOUNTER — Other Ambulatory Visit: Payer: Self-pay

## 2022-07-20 ENCOUNTER — Encounter (HOSPITAL_BASED_OUTPATIENT_CLINIC_OR_DEPARTMENT_OTHER)
Admission: RE | Admit: 2022-07-20 | Discharge: 2022-07-20 | Disposition: A | Discharge status: Home / Self Care | Payer: BC Managed Care – PPO | Source: Ambulatory Visit | Attending: Orthopaedic Surgery | Admitting: Orthopaedic Surgery

## 2022-07-20 DIAGNOSIS — M948X6 Other specified disorders of cartilage, lower leg: Secondary | ICD-10-CM | POA: Diagnosis not present

## 2022-07-20 DIAGNOSIS — M25862 Other specified joint disorders, left knee: Secondary | ICD-10-CM | POA: Diagnosis not present

## 2022-07-20 DIAGNOSIS — Z01812 Encounter for preprocedural laboratory examination: Secondary | ICD-10-CM | POA: Insufficient documentation

## 2022-07-20 DIAGNOSIS — I10 Essential (primary) hypertension: Secondary | ICD-10-CM | POA: Diagnosis not present

## 2022-07-20 DIAGNOSIS — Z79899 Other long term (current) drug therapy: Secondary | ICD-10-CM | POA: Diagnosis not present

## 2022-07-20 LAB — BASIC METABOLIC PANEL
Anion gap: 9 (ref 5–15)
BUN: 18 mg/dL (ref 6–20)
CO2: 27 mmol/L (ref 22–32)
Calcium: 9.5 mg/dL (ref 8.9–10.3)
Chloride: 101 mmol/L (ref 98–111)
Creatinine, Ser: 1.26 mg/dL — ABNORMAL HIGH (ref 0.61–1.24)
GFR, Estimated: >60 mL/min (ref >60)
Glucose, Bld: 101 mg/dL — ABNORMAL HIGH (ref 70–99)
Potassium: 4.2 mmol/L (ref 3.5–5.1)
Sodium: 137 mmol/L (ref 135–145)

## 2022-07-22 NOTE — H&P (Signed)
PREOPERATIVE H&P  Chief Complaint: LEFT KNEE CARTILAGE FISSURING, OSTEOCHONDRAL LESION  HPI: Walter Roberson is a 36 y.o. male who is scheduled for, Procedure(s): ARTHROSCOPY KNEE CHONDROPLASTY.   Patient has a past medical history significant for HTN.   The patient was doing great until early August. 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 He had an acute traumatic start of pain. He feels like it is pre-surgery.  He feels like something is moving around inside his knee. He twisted his knee and he has had pain and swelling after. He tried to rest, ice and elevate for the last two weeks.   Symptoms are rated as moderate to severe, and have been worsening.  This is significantly impairing activities of daily living.    Please see clinic note for further details on this patient's care.    He has elected for surgical management.   Past Medical History:  Diagnosis Date   ADHD    HTN (hypertension)    Overweight (BMI 25.0-29.9)    Past Surgical History:  Procedure Laterality Date   CARPAL TUNNEL RELEASE Right    CHONDROPLASTY Left 09/10/2021   Procedure: CHONDROPLASTY;  Surgeon: Bjorn Pippin, MD;  Location: [facility];  Service: Orthopedics;  Laterality: Left;   FINGER CLOSED REDUCTION     KNEE ARTHROSCOPY Left 09/10/2021   Procedure: ARTHROSCOPY KNEE WITH LOOSE BODY EXCISION;  Surgeon: Bjorn Pippin, MD;  Location: [facility];  Service: Orthopedics;  Laterality: Left;   KNEE ARTHROSCOPY WITH OSTEOCHONDRAL DEFECT REPAIR Left 03/01/2019   Procedure: LEFT KNEE ARTHROSCOPY WITH OSTEOCHARAL DEFECT FIXATION MICROFRACTURE;  Surgeon: Dannielle Huh, MD;  Location: [facility];  Service: Orthopedics;  Laterality: Left;   ORIF ANKLE FRACTURE BIMALLEOLAR Left    x2   OSTEOCHONDRAL DEFECT REPAIR/RECONSTRUCTION Left 09/10/2021   Procedure: OSTEOCHONDRAL DEFECT REPAIR/RECONSTRUCTION;  Surgeon: Bjorn Pippin, MD;  Location:  [facility];  Service: Orthopedics;  Laterality: Left;   REPAIR ANKLE LIGAMENT Left    Social History   Socioeconomic History   Marital status: Divorced    Spouse name: Not on file   Number of children: Not on file   Years of education: Not on file   Highest education level: Master's degree (e.g., MA, MS, MEng, MEd, MSW, MBA)  Occupational History   Not on file  Tobacco Use   Smoking status: Never   Smokeless tobacco: Never  Vaping Use   Vaping Use: Never used  Substance and Sexual Activity   Alcohol use: Yes    Alcohol/week: 4.0 standard drinks of alcohol    Types: 4 Cans of beer per week    Comment: a week   Drug use: Never   Sexual activity: Not on file  Other Topics Concern   Not on file  Social History Narrative   Not on file   Social Determinants of Health   Financial Resource Strain: Unknown (09/23/2021)   Overall Financial Resource Strain (CARDIA)    Difficulty of Paying Living Expenses: Patient refused  Food Insecurity: Unknown (09/23/2021)   Hunger Vital Sign    Worried About Running Out of Food in the Last Year: Patient refused    Ran Out of Food in the Last Year: Patient refused  Transportation Needs: No Transportation Needs (09/23/2021)   PRAPARE - Administrator, Civil Service (Medical): No    Lack of Transportation (Non-Medical): No  Physical Activity: Insufficiently Active (09/23/2021)  Exercise Vital Sign    Days of Exercise per Week: 2 days    Minutes of Exercise per Session: 40 min  Stress: No Stress Concern Present (09/23/2021)   [employer] of Occupational Health - Occupational Stress Questionnaire    Feeling of Stress : Only a little  Social Connections: Unknown (09/23/2021)   Social Connection and Isolation Panel [NHANES]    Frequency of Communication with Friends and Family: Patient refused    Frequency of Social Gatherings with Friends and Family: Not on file    Attends Religious Services: Patient refused     Active Member of Clubs or Organizations: No    Attends Engineer, structural: Not on file    Marital Status: Patient refused   Family History  Problem Relation Age of Onset   Hypertension Father    Diabetes Brother    No Known Allergies Prior to Admission medications   Medication Sig Start Date End Date Taking? Authorizing Provider  amphetamine-dextroamphetamine (ADDERALL XR) 30 MG 24 hr capsule Take 1 capsule (30 mg total) by mouth every morning. 07/08/22  Yes Philip Aspen, Limmie Patricia, MD  hydrochlorothiazide (HYDRODIURIL) 25 MG tablet Take 1 tablet (25 mg total) by mouth daily. 05/28/22  Yes Philip Aspen, Limmie Patricia, MD  lisinopril (ZESTRIL) 10 MG tablet TAKE 1 TABLET BY MOUTH EVERY DAY 06/05/22  Yes Philip Aspen, Limmie Patricia, MD  methocarbamol (ROBAXIN) 500 MG tablet Take 1 tablet (500 mg total) by mouth every 8 (eight) hours as needed for muscle spasms. 09/10/21  Yes Pamalee Marcoe, Jerald Kief, PA-C  amphetamine-dextroamphetamine (ADDERALL XR) 30 MG 24 hr capsule Take 1 capsule (30 mg total) by mouth every morning. 07/08/22   Philip Aspen, Limmie Patricia, MD  amphetamine-dextroamphetamine (ADDERALL XR) 30 MG 24 hr capsule Take 1 capsule (30 mg total) by mouth every morning. 07/08/22   Philip Aspen, Limmie Patricia, MD  diclofenac (VOLTAREN) 75 MG EC tablet Take 1 tablet (75 mg total) by mouth 2 (two) times daily. 09/10/21   Janaa Acero, Jerald Kief, PA-C    ROS: All other systems have been reviewed and were otherwise negative with the exception of those mentioned in the HPI and as above.  Physical Exam: General: Alert, no acute distress Cardiovascular: No pedal edema Respiratory: No cyanosis, no use of accessory musculature GI: No organomegaly, abdomen is soft and non-tender Skin: No lesions in the area of chief complaint Neurologic: Sensation intact distally Psychiatric: Patient is competent for consent with normal mood and affect Lymphatic: No axillary or cervical  lymphadenopathy  MUSCULOSKELETAL:  He is tender to palpation over the medial femoral condyle.  Full range of motion. No obvious effusion today. Ligamentous examination is normal.  Imaging: MRI demonstrates possible medial meniscus tear, possible unstable osteochondral lesion though this is difficult to assess  Assessment: LEFT KNEE CARTILAGE FISSURING, OSTEOCHONDRAL LESION  Plan: Plan for Procedure(s): ARTHROSCOPY KNEE CHONDROPLASTY  The risks benefits and alternatives were discussed with the patient including but not limited to the risks of nonoperative treatment, versus surgical intervention including infection, bleeding, nerve injury,  blood clots, cardiopulmonary complications, morbidity, mortality, among others, and they were willing to proceed.   The patient acknowledged the explanation, agreed to proceed with the plan and consent was signed.   Operative Plan: Left knee arthroscopy with chondroplasty and possible meniscus tear Discharge Medications: Standard DVT Prophylaxis: Aspirin Physical Therapy: Outpatient PT Special Discharge needs: +/-   Vernetta Honey, PA-C  07/22/2022 8:45 PM

## 2022-07-23 ENCOUNTER — Encounter (HOSPITAL_BASED_OUTPATIENT_CLINIC_OR_DEPARTMENT_OTHER): Payer: Self-pay | Admitting: Orthopaedic Surgery

## 2022-07-23 ENCOUNTER — Other Ambulatory Visit: Payer: Self-pay

## 2022-07-23 ENCOUNTER — Ambulatory Visit (HOSPITAL_BASED_OUTPATIENT_CLINIC_OR_DEPARTMENT_OTHER): Payer: BC Managed Care – PPO | Admitting: Anesthesiology

## 2022-07-23 ENCOUNTER — Ambulatory Visit (HOSPITAL_COMMUNITY): Payer: BC Managed Care – PPO

## 2022-07-23 ENCOUNTER — Encounter (HOSPITAL_BASED_OUTPATIENT_CLINIC_OR_DEPARTMENT_OTHER)
Admission: RE | Disposition: A | Discharge status: Home / Self Care | Payer: Self-pay | Source: Home / Self Care | Attending: Orthopaedic Surgery

## 2022-07-23 ENCOUNTER — Ambulatory Visit (HOSPITAL_BASED_OUTPATIENT_CLINIC_OR_DEPARTMENT_OTHER)
Admission: RE | Admit: 2022-07-23 | Discharge: 2022-07-23 | Disposition: A | Discharge status: Home / Self Care | Payer: BC Managed Care – PPO | Attending: Orthopaedic Surgery | Admitting: Orthopaedic Surgery

## 2022-07-23 DIAGNOSIS — M2242 Chondromalacia patellae, left knee: Secondary | ICD-10-CM | POA: Diagnosis not present

## 2022-07-23 DIAGNOSIS — I10 Essential (primary) hypertension: Secondary | ICD-10-CM | POA: Insufficient documentation

## 2022-07-23 DIAGNOSIS — M25862 Other specified joint disorders, left knee: Secondary | ICD-10-CM | POA: Diagnosis not present

## 2022-07-23 DIAGNOSIS — M238X2 Other internal derangements of left knee: Secondary | ICD-10-CM | POA: Diagnosis not present

## 2022-07-23 DIAGNOSIS — Z79899 Other long term (current) drug therapy: Secondary | ICD-10-CM | POA: Insufficient documentation

## 2022-07-23 DIAGNOSIS — M948X6 Other specified disorders of cartilage, lower leg: Secondary | ICD-10-CM | POA: Insufficient documentation

## 2022-07-23 HISTORY — PX: KNEE ARTHROSCOPY: SHX127

## 2022-07-23 SURGERY — ARTHROSCOPY, KNEE
Anesthesia: General | Site: Knee | Laterality: Left

## 2022-07-23 MED ORDER — CEFAZOLIN SODIUM-DEXTROSE 2-4 GM/100ML-% IV SOLN
INTRAVENOUS | Status: AC
  Filled 2022-07-23: qty 100

## 2022-07-23 MED ORDER — BUPIVACAINE HCL 0.25 % IJ SOLN
INTRAMUSCULAR | Status: DC | PRN
  Administered 2022-07-23: 20 mL

## 2022-07-23 MED ORDER — OXYCODONE HCL 5 MG PO TABS
ORAL_TABLET | ORAL | Status: AC
  Filled 2022-07-23: qty 1

## 2022-07-23 MED ORDER — LIDOCAINE 2% (20 MG/ML) 5 ML SYRINGE
INTRAMUSCULAR | Status: AC
  Filled 2022-07-23: qty 5

## 2022-07-23 MED ORDER — ACETAMINOPHEN 500 MG PO TABS
ORAL_TABLET | ORAL | Status: AC
  Filled 2022-07-23: qty 2

## 2022-07-23 MED ORDER — PROPOFOL 10 MG/ML IV BOLUS
INTRAVENOUS | Status: AC
  Filled 2022-07-23: qty 20

## 2022-07-23 MED ORDER — MELOXICAM 15 MG PO TABS: 15.0000 mg | ORAL_TABLET | Freq: Every day | ORAL | 0 refills | Status: AC

## 2022-07-23 MED ORDER — SODIUM CHLORIDE 0.9 % IR SOLN
Status: DC | PRN
  Administered 2022-07-23: 3000 mL

## 2022-07-23 MED ORDER — FENTANYL CITRATE (PF) 100 MCG/2ML IJ SOLN
INTRAMUSCULAR | Status: AC
  Filled 2022-07-23: qty 2

## 2022-07-23 MED ORDER — KETOROLAC TROMETHAMINE 30 MG/ML IJ SOLN
INTRAMUSCULAR | Status: DC | PRN
  Administered 2022-07-23: 30 mg via INTRAVENOUS

## 2022-07-23 MED ORDER — PHENYLEPHRINE 80 MCG/ML (10ML) SYRINGE FOR IV PUSH (FOR BLOOD PRESSURE SUPPORT)
PREFILLED_SYRINGE | INTRAVENOUS | Status: AC
  Filled 2022-07-23: qty 10

## 2022-07-23 MED ORDER — MIDAZOLAM HCL 2 MG/2ML IJ SOLN
INTRAMUSCULAR | Status: AC
  Filled 2022-07-23: qty 2

## 2022-07-23 MED ORDER — FENTANYL CITRATE (PF) 100 MCG/2ML IJ SOLN
INTRAMUSCULAR | Status: DC | PRN
  Administered 2022-07-23 (×2): 50 ug via INTRAVENOUS

## 2022-07-23 MED ORDER — ACETAMINOPHEN 500 MG PO TABS: 1000.0000 mg | ORAL_TABLET | Freq: Three times a day (TID) | ORAL | 0 refills | Status: AC

## 2022-07-23 MED ORDER — PHENYLEPHRINE HCL (PRESSORS) 10 MG/ML IV SOLN
INTRAVENOUS | Status: DC | PRN
  Administered 2022-07-23 (×2): 80 ug via INTRAVENOUS

## 2022-07-23 MED ORDER — ONDANSETRON HCL 4 MG/2ML IJ SOLN
INTRAMUSCULAR | Status: AC
  Filled 2022-07-23: qty 2

## 2022-07-23 MED ORDER — CEFAZOLIN SODIUM-DEXTROSE 2-4 GM/100ML-% IV SOLN
2.0000 g | INTRAVENOUS | Status: AC
  Administered 2022-07-23: 2 g via INTRAVENOUS

## 2022-07-23 MED ORDER — OXYCODONE HCL 5 MG PO TABS
5.0000 mg | ORAL_TABLET | Freq: Once | ORAL | Status: AC
  Administered 2022-07-23: 5 mg via ORAL

## 2022-07-23 MED ORDER — FENTANYL CITRATE (PF) 100 MCG/2ML IJ SOLN
25.0000 ug | INTRAMUSCULAR | Status: DC | PRN
  Administered 2022-07-23 (×2): 50 ug via INTRAVENOUS

## 2022-07-23 MED ORDER — LACTATED RINGERS IV SOLN: INTRAVENOUS | Status: DC

## 2022-07-23 MED ORDER — ONDANSETRON HCL 4 MG PO TABS: 4.0000 mg | ORAL_TABLET | Freq: Three times a day (TID) | ORAL | 0 refills | Status: AC | PRN

## 2022-07-23 MED ORDER — BUPIVACAINE HCL (PF) 0.25 % IJ SOLN
INTRAMUSCULAR | Status: AC
  Filled 2022-07-23: qty 30

## 2022-07-23 MED ORDER — LIDOCAINE HCL (CARDIAC) PF 100 MG/5ML IV SOSY
PREFILLED_SYRINGE | INTRAVENOUS | Status: DC | PRN
  Administered 2022-07-23: 80 mg via INTRATRACHEAL

## 2022-07-23 MED ORDER — ONDANSETRON HCL 4 MG/2ML IJ SOLN
INTRAMUSCULAR | Status: DC | PRN
  Administered 2022-07-23: 4 mg via INTRAVENOUS

## 2022-07-23 MED ORDER — DEXMEDETOMIDINE HCL IN NACL 80 MCG/20ML IV SOLN
INTRAVENOUS | Status: DC | PRN
  Administered 2022-07-23: 8 ug via BUCCAL

## 2022-07-23 MED ORDER — MIDAZOLAM HCL 5 MG/5ML IJ SOLN
INTRAMUSCULAR | Status: DC | PRN
  Administered 2022-07-23: 2 mg via INTRAVENOUS

## 2022-07-23 MED ORDER — DEXAMETHASONE SODIUM PHOSPHATE 10 MG/ML IJ SOLN
INTRAMUSCULAR | Status: AC
  Filled 2022-07-23: qty 1

## 2022-07-23 MED ORDER — AMISULPRIDE (ANTIEMETIC) 5 MG/2ML IV SOLN: 10.0000 mg | Freq: Once | INTRAVENOUS | Status: DC | PRN

## 2022-07-23 MED ORDER — PROPOFOL 10 MG/ML IV BOLUS
INTRAVENOUS | Status: DC | PRN
  Administered 2022-07-23: 300 mg via INTRAVENOUS
  Administered 2022-07-23: 50 mg via INTRAVENOUS

## 2022-07-23 MED ORDER — DEXAMETHASONE SODIUM PHOSPHATE 10 MG/ML IJ SOLN
INTRAMUSCULAR | Status: DC | PRN
  Administered 2022-07-23: 10 mg via INTRAVENOUS

## 2022-07-23 MED ORDER — OXYCODONE HCL 5 MG PO TABS: ORAL_TABLET | ORAL | 0 refills | Status: AC

## 2022-07-23 MED ORDER — ASPIRIN 81 MG PO CHEW: 81.0000 mg | CHEWABLE_TABLET | Freq: Two times a day (BID) | ORAL | 0 refills | Status: AC

## 2022-07-23 MED ORDER — ACETAMINOPHEN 500 MG PO TABS
1000.0000 mg | ORAL_TABLET | Freq: Once | ORAL | Status: AC
  Administered 2022-07-23: 1000 mg via ORAL

## 2022-07-23 SURGICAL SUPPLY — 37 items
APL PRP STRL LF DISP 70% ISPRP (MISCELLANEOUS) ×1
BANDAGE ESMARK 6X9 LF (GAUZE/BANDAGES/DRESSINGS) IMPLANT
BLADE CLIPPER SURG (BLADE) IMPLANT
BNDG CMPR 9X6 STRL LF SNTH (GAUZE/BANDAGES/DRESSINGS)
BNDG ELASTIC 6X5.8 VLCR STR LF (GAUZE/BANDAGES/DRESSINGS) ×1 IMPLANT
BNDG ESMARK 6X9 LF (GAUZE/BANDAGES/DRESSINGS)
CHLORAPREP W/TINT 26 (MISCELLANEOUS) ×1 IMPLANT
CLSR STERI-STRIP ANTIMIC 1/2X4 (GAUZE/BANDAGES/DRESSINGS) ×1 IMPLANT
COOLER ICEMAN CLASSIC (MISCELLANEOUS) IMPLANT
CUFF TOURN SGL QUICK 34 (TOURNIQUET CUFF)
CUFF TRNQT CYL 34X4.125X (TOURNIQUET CUFF) IMPLANT
DISSECTOR 3.5MM X 13CM CVD (MISCELLANEOUS) IMPLANT
DISSECTOR 4.0MMX13CM CVD (MISCELLANEOUS) ×1 IMPLANT
DRAPE ARTHROSCOPY W/POUCH 90 (DRAPES) ×1 IMPLANT
DRAPE IMP U-DRAPE 54X76 (DRAPES) ×1 IMPLANT
DRAPE U-SHAPE 47X51 STRL (DRAPES) ×1 IMPLANT
GAUZE SPONGE 4X4 12PLY STRL (GAUZE/BANDAGES/DRESSINGS) ×1 IMPLANT
GAUZE XEROFORM 1X8 LF (GAUZE/BANDAGES/DRESSINGS) ×1 IMPLANT
GLOVE BIO SURGEON STRL SZ 6.5 (GLOVE) ×1 IMPLANT
GLOVE BIOGEL PI IND STRL 6.5 (GLOVE) ×1 IMPLANT
GLOVE BIOGEL PI IND STRL 8 (GLOVE) ×1 IMPLANT
GLOVE ECLIPSE 8.0 STRL XLNG CF (GLOVE) ×2 IMPLANT
GOWN STRL REUS W/ TWL LRG LVL3 (GOWN DISPOSABLE) ×2 IMPLANT
GOWN STRL REUS W/TWL LRG LVL3 (GOWN DISPOSABLE) ×2
GOWN STRL REUS W/TWL XL LVL3 (GOWN DISPOSABLE) ×1 IMPLANT
KIT TURNOVER KIT B (KITS) ×1 IMPLANT
MANIFOLD NEPTUNE II (INSTRUMENTS) IMPLANT
NS IRRIG 1000ML POUR BTL (IV SOLUTION) IMPLANT
PACK ARTHROSCOPY DSU (CUSTOM PROCEDURE TRAY) ×1 IMPLANT
PAD ARMBOARD 7.5X6 YLW CONV (MISCELLANEOUS) ×2 IMPLANT
PAD ORTHO SHOULDER 7X19 LRG (SOFTGOODS) IMPLANT
PADDING CAST COTTON 6X4 STRL (CAST SUPPLIES) IMPLANT
SLEEVE SCD COMPRESS KNEE MED (STOCKING) IMPLANT
SUT MNCRL AB 4-0 PS2 18 (SUTURE) ×1 IMPLANT
TOWEL GREEN STERILE FF (TOWEL DISPOSABLE) ×1 IMPLANT
TUBING ARTHROSCOPY IRRIG 16FT (MISCELLANEOUS) ×1 IMPLANT
WATER STERILE IRR 1000ML POUR (IV SOLUTION) ×1 IMPLANT

## 2022-07-23 NOTE — Discharge Instructions (Addendum)
Ramond Marrow MD, MPH Alfonse Alpers, PA-C [facility] [address], [address] [phone] (tel)   [phone] (fax)   POST-OPERATIVE INSTRUCTIONS - Knee Arthroscopy  WOUND CARE - You may remove the Operative Dressing on Post-Op Day #3 (72hrs after surgery).   -  Alternatively if you would like you can leave dressing on until follow-up if within 7-8 days but keep it dry. - Leave steri-strips in place until they fall off on their own, usually 2 weeks postop. - An ACE wrap may be used to control swelling, do not wrap this too tight.  If the initial ACE wrap feels too tight you may loosen it. - There may be a small amount of fluid/bleeding leaking at the surgical site.  - This is normal; the knee is filled with fluid during the procedure and can leak for 24-48hrs after surgery. You may change/reinforce the bandage as needed.  - Use the Cryocuff or Ice as often as possible for the first 7 days, then as needed for pain relief. Always keep a towel, ACE wrap or other barrier between the cooling unit and your skin.  - You may shower on Post-Op Day #3. Gently pat the area dry.  - Do not soak the knee in water or submerge it.  - Do not go swimming in the pool or ocean until 4 weeks after surgery or when otherwise instructed.  Keep incisions as dry as possible.   BRACE/AMBULATION -            You will not need a brace after this procedure.   - You may use crutches initially to help you weight bear, but this is not required - You can put full weight on your operative leg as you feel comfortable  PHYSICAL THERAPY - You will begin physical therapy soon after surgery (unless otherwise specified) - Please call to set up an appointment, if you do not already have one  - Let our office if there are any issues with scheduling your therapy  - You have a physical therapy appointment scheduled at [facility] (across the hall from our office) on Tuesday, 9/19 @845  am  REGIONAL ANESTHESIA  (NERVE BLOCKS) The anesthesia team may have performed a nerve block for you this is a great tool used to minimize pain.   The block may start wearing off overnight (between 8-24 hours postop) When the block wears off, your pain may go from nearly zero to the pain you would have had postop without the block. This is an abrupt transition but nothing dangerous is happening.   This can be a challenging period but utilize your as needed pain medications to try and manage this period. We suggest you use the pain medication the first night prior to going to bed, to ease this transition.  You may take an extra dose of narcotic when this happens if needed   POST-OP MEDICATIONS- Multimodal approach to pain control In general your pain will be controlled with a combination of substances.  Prescriptions unless otherwise discussed are electronically sent to your pharmacy.  This is a carefully made plan we use to minimize narcotic use.     Meloxicam - Anti-inflammatory medication taken on a scheduled basis Acetaminophen - Non-narcotic pain medicine taken on a scheduled basis  Oxycodone - This is a strong narcotic, to be used only on an "as needed" basis for SEVERE pain. Aspirin 81mg  - This medicine is used to minimize the risk of blood clots after  surgery. Zofran - take as needed for nausea   FOLLOW-UP   Please call the office to schedule a follow-up appointment for your incision check, 7-10 days post-operatively.   IF YOU HAVE ANY QUESTIONS, PLEASE FEEL FREE TO CALL OUR OFFICE.   HELPFUL INFORMATION   Keep your leg elevated to decrease swelling, which will then in turn decrease your pain. I would elevate the foot of your bed by putting a couple of couch pillows between your mattress and box spring. I would not keep pillow directly under your ankle.  - Do not sleep with a pillow behind your knee even if it is more comfortable as this may make it harder to get your knee fully straight long  term.   There will be MORE swelling on days 1-3 than there is on the day of surgery.  This also is normal. The swelling will decrease with the anti-inflammatory medication, ice and keeping it elevated. The swelling will make it more difficult to bend your knee. As the swelling goes down your motion will become easier   You may develop swelling and bruising that extends from your knee down to your calf and perhaps even to your foot over the next week. Do not be alarmed. This too is normal, and it is due to gravity   There may be some numbness adjacent to the incision site. This may last for 6-12 months or longer in some patients and is expected.   You may return to sedentary work/school in the next couple of days when you feel up to it. You will need to keep your leg elevated as much as possible    You should wean off your narcotic medicines as soon as you are able.  Most patients will be off or using minimal narcotics before their first postop appointment.    We suggest you use the pain medication the first night prior to going to bed, in order to ease any pain when the anesthesia wears off. You should avoid taking pain medications on an empty stomach as it will make you nauseous.   Do not drink alcoholic beverages or take illicit drugs when taking pain medications.   It is against the law to drive while taking narcotics. You cannot drive if your Right leg is in brace locked in extension.   Pain medication may make you constipated.  Below are a few solutions to try in this order:  o Decrease the amount of pain medication if you aren't having pain.  o Drink lots of decaffeinated fluids.  o Drink prune juice and/or eat dried prunes   o If the first 3 don't work start with additional solutions  o Take Colace - an over-the-counter stool softener  o Take Senokot - an over-the-counter laxative  o Take Miralax - a stronger over-the-counter laxative    For more information including  helpful videos and documents visit our website:   [email or web address]    Post Anesthesia Home Care Instructions  Activity: Get plenty of rest for the remainder of the day. A responsible individual must stay with you for 24 hours following the procedure.  For the next 24 hours, DO NOT: -Drive a car -Advertising copywriter -Drink alcoholic beverages -Take any medication unless instructed by your physician -Make any legal decisions or sign important papers.  Meals: Start with liquid foods such as gelatin or soup. Progress to regular foods as tolerated. Avoid greasy, spicy, heavy foods. If nausea and/or vomiting occur, drink only clear liquids until  the nausea and/or vomiting subsides. Call your physician if vomiting continues.  Special Instructions/Symptoms: Your throat may feel dry or sore from the anesthesia or the breathing tube placed in your throat during surgery. If this causes discomfort, gargle with warm salt water. The discomfort should disappear within 24 hours.  If you had a scopolamine patch placed behind your ear for the management of post- operative nausea and/or vomiting:  1. The medication in the patch is effective for 72 hours, after which it should be removed.  Wrap patch in a tissue and discard in the trash. Wash hands thoroughly with soap and water. 2. You may remove the patch earlier than 72 hours if you experience unpleasant side effects which may include dry mouth, dizziness or visual disturbances. 3. Avoid touching the patch. Wash your hands with soap and water after contact with the patch.        Next dose of Tylenol may be given at 1p

## 2022-07-23 NOTE — Anesthesia Preprocedure Evaluation (Addendum)
Anesthesia Evaluation  Patient identified by MRN, date of birth, ID band Patient awake    Reviewed: Allergy & Precautions, NPO status , Patient's Chart, lab work & pertinent test results  Airway Mallampati: II  TM Distance: >3 FB Neck ROM: Full    Dental  (+) Dental Advisory Given   Pulmonary neg pulmonary ROS,    breath sounds clear to auscultation       Cardiovascular hypertension, Pt. on medications  Rhythm:Regular Rate:Normal     Neuro/Psych negative neurological ROS     GI/Hepatic negative GI ROS, Neg liver ROS,   Endo/Other  negative endocrine ROS  Renal/GU Renal InsufficiencyRenal disease     Musculoskeletal   Abdominal   Peds  Hematology negative hematology ROS (+)   Anesthesia Other Findings   Reproductive/Obstetrics                             Lab Results  Component Value Date   WBC 5.6 10/14/2019   HGB 16.8 10/14/2019   HCT 48.4 10/14/2019   MCV 89.8 10/14/2019   PLT 150 10/14/2019   Lab Results  Component Value Date   CREATININE 1.26 (H) 07/20/2022   BUN 18 07/20/2022   NA 137 07/20/2022   K 4.2 07/20/2022   CL 101 07/20/2022   CO2 27 07/20/2022    Anesthesia Physical Anesthesia Plan  ASA: 2  Anesthesia Plan: General   Post-op Pain Management: Tylenol PO (pre-op)* and Toradol IV (intra-op)*   Induction: Intravenous  PONV Risk Score and Plan: 2 and Dexamethasone, Ondansetron and Treatment may vary due to age or medical condition  Airway Management Planned: LMA  Additional Equipment: None  Intra-op Plan:   Post-operative Plan: Extubation in OR  Informed Consent: I have reviewed the patients History and Physical, chart, labs and discussed the procedure including the risks, benefits and alternatives for the proposed anesthesia with the patient or authorized representative who has indicated his/her understanding and acceptance.     Dental advisory  given  Plan Discussed with: CRNA  Anesthesia Plan Comments:         Anesthesia Quick Evaluation

## 2022-07-23 NOTE — Op Note (Signed)
Orthopaedic Surgery Operative Note (CSN: [id number])  Walter Roberson  28-Nov-1985 Date of Surgery: 07/23/2022   Diagnoses:  LEFT KNEE CARTILAGE FISSURING, OSTEOCHONDRAL LESION  Procedure: Left knee chondroplasty   Operative Finding Full motion no limitation no instability.  Patient had his medial graft that appeared to be well fixed.  There is an area at the posterior aspect of the graft that was somewhat soft and there was able to get a probe in the interface between the graft and the native cartilage.  There were some areas of loose flaps on the far medial side of the graft however the level of subsidence that was visualized on the MRI was not appreciated intraoperatively.  The graft itself appeared to be very well fixed and there was softening as I would expect with the graft is only about a-year-old.  The medial meniscus was normal, tibia was normal, there was a central area with a loose fragment of cartilage in the mid trochlea.  The patella was normal.  Lateral compartment normal.  The patient had continued mechanical type symptoms and pain we would likely have to consider a revision graft however I would try to avoid this if possible.  On exam in the operating room I was quite pleased overall with what the graft look like and if there is some subtle instability of the bone cartilage junction that the patient is feeling it is not appreciable in the operating room.  Successful completion of the planned procedure.    Post-operative plan: The patient will be weightbearing to tolerance.  The patient will be discharged home.  DVT prophylaxis Aspirin 81 mg twice daily for 6 weeks.  Pain control with PRN pain medication preferring oral medicines.  Follow up plan will be scheduled in approximately 7 days for incision check.  Post-Op Diagnosis: Same Surgeons:Primary: Bjorn Pippin, MD Assistants:Caroline McBane PA-C Location: [facility] OR ROOM 1 Anesthesia: General with local Antibiotics: Ancef 2 g  with local vancomycin powder 1 g at the surgical site Tourniquet time:  Total Tourniquet Time Documented: Thigh (Left) - 18 minutes Total: Thigh (Left) - 18 minutes  Estimated Blood Loss: Minimal Complications: None Specimens: None Implants: * No implants in log *  Indications for Surgery:   Walter Roberson is a 36 y.o. male with previous history of an osteochondral allograft with new mechanical symptoms.  Benefits and risks of operative and nonoperative management were discussed prior to surgery with patient/guardian(s) and informed consent form was completed.  Specific risks including infection, need for additional surgery, continued pain, need for revision cartilage transplantation.   Procedure:   The patient was identified properly. Informed consent was obtained and the surgical site was marked. The patient was taken up to suite where general anesthesia was induced. The patient was placed in the supine position with a post against the surgical leg and a nonsterile tourniquet applied. The surgical leg was then prepped and draped usual sterile fashion.  A standard surgical timeout was performed.  2 standard anterior portals were made and diagnostic arthroscopy performed. Please note the findings as noted above.  We identified the cartilage lesion on the medial aspect of the knee.  We were able to trim back any loose fragments as well as used a shaver to debride the junction on the far medial aspect which was nearly extra-articular.  There was a loose fragment of cartilage with a mobile type hinge in the trochlea.  We debrided this back to a stable base.  This was about  2 mm wide and about 8 cm long.  Incisions closed with absorbable suture. The patient was awoken from general anesthesia and taken to the PACU in stable condition without complication.   Alfonse Alpers, PA-C, present and scrubbed throughout the case, critical for completion in a timely fashion, and for retraction,  instrumentation, closure.

## 2022-07-23 NOTE — Interval H&P Note (Signed)
All questions answered, patient wants to proceed with procedure. ? ?

## 2022-07-23 NOTE — Anesthesia Postprocedure Evaluation (Signed)
Anesthesia Post Note  Patient: Walter Roberson  Procedure(s) Performed: ARTHROSCOPY KNEE CHONDROPLASTY (Left: Knee)     Patient location during evaluation: PACU Anesthesia Type: General Level of consciousness: awake and alert Pain management: pain level controlled Vital Signs Assessment: post-procedure vital signs reviewed and stable Respiratory status: spontaneous breathing, nonlabored ventilation, respiratory function stable and patient connected to nasal cannula oxygen Cardiovascular status: blood pressure returned to baseline and stable Postop Assessment: no apparent nausea or vomiting Anesthetic complications: no   No notable events documented.  Last Vitals:  Vitals:   07/23/22 0837 07/23/22 0859  BP: 112/73 114/87  Pulse: 83 78  Resp: 18 18  Temp:  (!) 36.2 C  SpO2: 99% 94%    Last Pain:  Vitals:   07/23/22 0852  TempSrc:   PainSc: 4                  Kennieth Rad

## 2022-07-23 NOTE — Anesthesia Procedure Notes (Signed)
Procedure Name: LMA Insertion Date/Time: 07/23/2022 7:23 AM  Performed by: Thornell Mule, CRNAPre-anesthesia Checklist: Patient identified, Emergency Drugs available, Suction available and Patient being monitored Patient Re-evaluated:Patient Re-evaluated prior to induction Oxygen Delivery Method: Circle system utilized Preoxygenation: Pre-oxygenation with 100% oxygen Induction Type: IV induction LMA: LMA inserted LMA Size: 5.0 Number of attempts: 1 Placement Confirmation: positive ETCO2 Tube secured with: Tape Dental Injury: Teeth and Oropharynx as per pre-operative assessment

## 2022-07-23 NOTE — Transfer of Care (Signed)
Immediate Anesthesia Transfer of Care Note  Patient: Walter Roberson  Procedure(s) Performed: ARTHROSCOPY KNEE CHONDROPLASTY (Left: Knee)  Patient Location: PACU  Anesthesia Type:General  Level of Consciousness: drowsy and patient cooperative  Airway & Oxygen Therapy: Patient Spontanous Breathing and Patient connected to face mask oxygen  Post-op Assessment: Report given to RN and Post -op Vital signs reviewed and stable  Post vital signs: Reviewed and stable  Last Vitals:  Vitals Value Taken Time  BP    Temp    Pulse 77 07/23/22 0801  Resp    SpO2 98 % 07/23/22 0801  Vitals shown include unvalidated device data.  Last Pain:  Vitals:   07/23/22 9935  TempSrc: Oral  PainSc: 1          Complications: No notable events documented.

## 2022-07-24 ENCOUNTER — Encounter (HOSPITAL_BASED_OUTPATIENT_CLINIC_OR_DEPARTMENT_OTHER): Payer: Self-pay | Admitting: Orthopaedic Surgery

## 2022-07-28 DIAGNOSIS — M949 Disorder of cartilage, unspecified: Secondary | ICD-10-CM | POA: Diagnosis not present

## 2022-07-28 DIAGNOSIS — M25672 Stiffness of left ankle, not elsewhere classified: Secondary | ICD-10-CM | POA: Diagnosis not present

## 2022-08-10 ENCOUNTER — Encounter: Payer: Self-pay | Admitting: Internal Medicine

## 2022-09-01 ENCOUNTER — Other Ambulatory Visit: Payer: Self-pay | Admitting: Internal Medicine

## 2022-09-01 DIAGNOSIS — I10 Essential (primary) hypertension: Secondary | ICD-10-CM

## 2022-09-01 MED ORDER — HYDROCHLOROTHIAZIDE 25 MG PO TABS: 25.0000 mg | ORAL_TABLET | Freq: Every day | ORAL | 1 refills | Status: DC

## 2022-09-03 ENCOUNTER — Other Ambulatory Visit: Payer: Self-pay | Admitting: Internal Medicine

## 2022-09-03 DIAGNOSIS — I10 Essential (primary) hypertension: Secondary | ICD-10-CM

## 2022-09-08 ENCOUNTER — Encounter: Payer: Self-pay | Admitting: Internal Medicine

## 2022-09-08 DIAGNOSIS — F900 Attention-deficit hyperactivity disorder, predominantly inattentive type: Secondary | ICD-10-CM

## 2022-09-09 NOTE — Telephone Encounter (Signed)
Rx already at the pharmacy.

## 2022-10-07 ENCOUNTER — Encounter: Payer: Self-pay | Admitting: Internal Medicine

## 2022-10-07 ENCOUNTER — Telehealth (INDEPENDENT_AMBULATORY_CARE_PROVIDER_SITE_OTHER): Payer: BC Managed Care – PPO | Admitting: Internal Medicine

## 2022-10-07 VITALS — Wt 200.0 lb

## 2022-10-07 DIAGNOSIS — R5383 Other fatigue: Secondary | ICD-10-CM | POA: Diagnosis not present

## 2022-10-07 DIAGNOSIS — F900 Attention-deficit hyperactivity disorder, predominantly inattentive type: Secondary | ICD-10-CM

## 2022-10-07 DIAGNOSIS — I10 Essential (primary) hypertension: Secondary | ICD-10-CM | POA: Diagnosis not present

## 2022-10-07 MED ORDER — AMPHETAMINE-DEXTROAMPHET ER 30 MG PO CP24: 30.0000 mg | ORAL_CAPSULE | ORAL | 0 refills | Status: DC

## 2022-10-07 NOTE — Progress Notes (Signed)
Virtual Visit via Video Note  I connected with Walter Roberson on 10/07/22 at  9:00 AM EST by a video enabled telemedicine application and verified that I am speaking with the correct person using two identifiers.  Location patient: home Location provider: work office Persons participating in the virtual visit: patient, provider  I discussed the limitations of evaluation and management by telemedicine and the availability of in person appointments. The patient expressed understanding and agreed to proceed.   HPI: This is a scheduled visit for medication refills per contract.  He is on Adderall XR 30 mg 1 tablet daily for his ADHD.  He is also complaining of increased fatigue.  He sleeps between 6 and 8 hours at night.  It is mostly restful sleep although he does wake up once to twice a night.   ROS: Constitutional: Denies fever, chills, diaphoresis, appetite change. HEENT: Denies photophobia, eye pain, redness, hearing loss, ear pain, congestion, sore throat, rhinorrhea, sneezing, mouth sores, trouble swallowing, neck pain, neck stiffness and tinnitus.   Respiratory: Denies SOB, DOE, cough, chest tightness,  and wheezing.   Cardiovascular: Denies chest pain, palpitations and leg swelling.  Gastrointestinal: Denies nausea, vomiting, abdominal pain, diarrhea, constipation, blood in stool and abdominal distention.  Genitourinary: Denies dysuria, urgency, frequency, hematuria, flank pain and difficulty urinating.  Endocrine: Denies: hot or cold intolerance, sweats, changes in hair or nails, polyuria, polydipsia. Musculoskeletal: Denies myalgias, back pain, joint swelling, arthralgias and gait problem.  Skin: Denies pallor, rash and wound.  Neurological: Denies dizziness, seizures, syncope, weakness, light-headedness, numbness and headaches.  Hematological: Denies adenopathy. Easy bruising, personal or family bleeding history  Psychiatric/Behavioral: Denies suicidal ideation, mood changes,  confusion, nervousness, sleep disturbance and agitation   Past Medical History:  Diagnosis Date   ADHD    HTN (hypertension)    Overweight (BMI 25.0-29.9)     Past Surgical History:  Procedure Laterality Date   CARPAL TUNNEL RELEASE Right    CHONDROPLASTY Left 09/10/2021   Procedure: CHONDROPLASTY;  Surgeon: Bjorn Pippin, MD;  Location: [facility];  Service: Orthopedics;  Laterality: Left;   FINGER CLOSED REDUCTION     KNEE ARTHROSCOPY Left 09/10/2021   Procedure: ARTHROSCOPY KNEE WITH LOOSE BODY EXCISION;  Surgeon: Bjorn Pippin, MD;  Location: [facility];  Service: Orthopedics;  Laterality: Left;   KNEE ARTHROSCOPY Left 07/23/2022   Procedure: ARTHROSCOPY KNEE CHONDROPLASTY;  Surgeon: Bjorn Pippin, MD;  Location: [facility];  Service: Orthopedics;  Laterality: Left;   KNEE ARTHROSCOPY WITH OSTEOCHONDRAL DEFECT REPAIR Left 03/01/2019   Procedure: LEFT KNEE ARTHROSCOPY WITH OSTEOCHARAL DEFECT FIXATION MICROFRACTURE;  Surgeon: Dannielle Huh, MD;  Location: [facility];  Service: Orthopedics;  Laterality: Left;   ORIF ANKLE FRACTURE BIMALLEOLAR Left    x2   OSTEOCHONDRAL DEFECT REPAIR/RECONSTRUCTION Left 09/10/2021   Procedure: OSTEOCHONDRAL DEFECT REPAIR/RECONSTRUCTION;  Surgeon: Bjorn Pippin, MD;  Location: [facility];  Service: Orthopedics;  Laterality: Left;   REPAIR ANKLE LIGAMENT Left     Family History  Problem Relation Age of Onset   Hypertension Father    Diabetes Brother     SOCIAL HX:   reports that he has never smoked. He has never used smokeless tobacco. He reports current alcohol use of about 4.0 standard drinks of alcohol per week. He reports that he does not use drugs.   Current Outpatient Medications:    amphetamine-dextroamphetamine (ADDERALL XR) 30 MG 24 hr capsule, Take 1  capsule (30 mg total) by mouth every morning., Disp: 30 capsule, Rfl: 0   amphetamine-dextroamphetamine (ADDERALL XR)  30 MG 24 hr capsule, Take 1 capsule (30 mg total) by mouth every morning., Disp: 30 capsule, Rfl: 0   hydrochlorothiazide (HYDRODIURIL) 25 MG tablet, Take 1 tablet (25 mg total) by mouth daily., Disp: 90 tablet, Rfl: 1   lisinopril (ZESTRIL) 10 MG tablet, TAKE 1 TABLET BY MOUTH EVERY DAY, Disp: 90 tablet, Rfl: 0   meloxicam (MOBIC) 15 MG tablet, Take 1 tablet (15 mg total) by mouth daily. For 2 weeks for pain and inflammation. Then take as needed, Disp: 30 tablet, Rfl: 0   methocarbamol (ROBAXIN) 500 MG tablet, Take 1 tablet (500 mg total) by mouth every 8 (eight) hours as needed for muscle spasms., Disp: 30 tablet, Rfl: 0  EXAM:   VITALS per patient if applicable: None reported  GENERAL: alert, oriented, appears well and in no acute distress  HEENT: atraumatic, conjunttiva clear, no obvious abnormalities on inspection of external nose and ears  NECK: normal movements of the head and neck  LUNGS: on inspection no signs of respiratory distress, breathing rate appears normal, no obvious gross increased work of breathing, gasping or wheezing  CV: no obvious cyanosis  MS: moves all visible extremities without noticeable abnormality  PSYCH/NEURO: pleasant and cooperative, no obvious depression or anxiety, speech and thought processing grossly intact  ASSESSMENT AND PLAN:   Attention deficit hyperactivity disorder (ADHD), predominantly inattentive type  Primary hypertension  Fatigue, unspecified type  -PDMP reviewed, no red flags, overdose risk score is 210. -Refill Adderall XR 30 mg to take 1 tablet daily for total of 30 tablets a month x 3 months. -States blood pressure has been improved, averages around 1 18-1 25 systolic with diastolics in the 80s. -For fatigue he will come and get labs as above, if continued issues can consider adding a low-dose immediate release Adderall in the early afternoon.   I discussed the assessment and treatment plan with the patient. The patient was  provided an opportunity to ask questions and all were answered. The patient agreed with the plan and demonstrated an understanding of the instructions.   The patient was advised to call back or seek an in-person evaluation if the symptoms worsen or if the condition fails to improve as anticipated.    Chaya Jan, MD  [facility] at [facility]

## 2022-10-30 ENCOUNTER — Other Ambulatory Visit (INDEPENDENT_AMBULATORY_CARE_PROVIDER_SITE_OTHER): Payer: BC Managed Care – PPO

## 2022-10-30 DIAGNOSIS — R5383 Other fatigue: Secondary | ICD-10-CM | POA: Diagnosis not present

## 2022-10-30 LAB — CBC WITH DIFFERENTIAL/PLATELET
Basophils Absolute: 0.1 10*3/uL (ref 0.0–0.1)
Basophils Relative: 0.5 % (ref 0.0–3.0)
Eosinophils Absolute: 0.1 10*3/uL (ref 0.0–0.7)
Eosinophils Relative: 0.9 % (ref 0.0–5.0)
HCT: 46.5 % (ref 39.0–52.0)
Hemoglobin: 16.1 g/dL (ref 13.0–17.0)
Lymphocytes Relative: 18.4 % (ref 12.0–46.0)
Lymphs Abs: 1.8 10*3/uL (ref 0.7–4.0)
MCHC: 34.7 g/dL (ref 30.0–36.0)
MCV: 88.2 fl (ref 78.0–100.0)
Monocytes Absolute: 0.6 10*3/uL (ref 0.1–1.0)
Monocytes Relative: 5.6 % (ref 3.0–12.0)
Neutro Abs: 7.4 10*3/uL (ref 1.4–7.7)
Neutrophils Relative %: 74.6 % (ref 43.0–77.0)
Platelets: 267 10*3/uL (ref 150.0–400.0)
RBC: 5.27 Mil/uL (ref 4.22–5.81)
RDW: 13.6 % (ref 11.5–15.5)
WBC: 9.9 10*3/uL (ref 4.0–10.5)

## 2022-10-30 LAB — COMPREHENSIVE METABOLIC PANEL
ALT: 50 U/L (ref 0–53)
AST: 28 U/L (ref 0–37)
Albumin: 4.6 g/dL (ref 3.5–5.2)
Alkaline Phosphatase: 73 U/L (ref 39–117)
BUN: 14 mg/dL (ref 6–23)
CO2: 26 mEq/L (ref 19–32)
Calcium: 9.9 mg/dL (ref 8.4–10.5)
Chloride: 102 mEq/L (ref 96–112)
Creatinine, Ser: 1.08 mg/dL (ref 0.40–1.50)
GFR: 87.72 mL/min (ref >60.00)
Glucose, Bld: 130 mg/dL — ABNORMAL HIGH (ref 70–99)
Potassium: 3.6 mEq/L (ref 3.5–5.1)
Sodium: 138 mEq/L (ref 135–145)
Total Bilirubin: 1 mg/dL (ref 0.2–1.2)
Total Protein: 7.4 g/dL (ref 6.0–8.3)

## 2022-10-31 LAB — TSH: TSH: 2.69 u[IU]/mL (ref 0.35–5.50)

## 2022-10-31 LAB — VITAMIN B12: Vitamin B-12: 384 pg/mL (ref 211–911)

## 2022-10-31 LAB — VITAMIN D 25 HYDROXY (VIT D DEFICIENCY, FRACTURES): VITD: 23.35 ng/mL — ABNORMAL LOW (ref 30.00–100.00)

## 2022-11-10 ENCOUNTER — Encounter: Payer: Self-pay | Admitting: Internal Medicine

## 2022-11-11 DIAGNOSIS — R0981 Nasal congestion: Secondary | ICD-10-CM | POA: Diagnosis not present

## 2022-11-11 DIAGNOSIS — M791 Myalgia, unspecified site: Secondary | ICD-10-CM | POA: Diagnosis not present

## 2022-11-11 DIAGNOSIS — R059 Cough, unspecified: Secondary | ICD-10-CM | POA: Diagnosis not present

## 2022-11-12 ENCOUNTER — Other Ambulatory Visit: Payer: Self-pay | Admitting: *Deleted

## 2022-11-12 ENCOUNTER — Encounter: Payer: Self-pay | Admitting: Internal Medicine

## 2022-11-12 ENCOUNTER — Other Ambulatory Visit: Payer: Self-pay | Admitting: Internal Medicine

## 2022-11-12 ENCOUNTER — Other Ambulatory Visit: Payer: BC Managed Care – PPO

## 2022-11-12 DIAGNOSIS — E559 Vitamin D deficiency, unspecified: Secondary | ICD-10-CM | POA: Insufficient documentation

## 2022-11-12 MED ORDER — VITAMIN D (ERGOCALCIFEROL) 1.25 MG (50000 UNIT) PO CAPS: 50000.0000 [IU] | ORAL_CAPSULE | ORAL | 0 refills | Status: DC

## 2022-11-30 ENCOUNTER — Other Ambulatory Visit: Payer: Self-pay | Admitting: Internal Medicine

## 2022-11-30 DIAGNOSIS — I10 Essential (primary) hypertension: Secondary | ICD-10-CM

## 2022-12-09 ENCOUNTER — Encounter: Payer: Self-pay | Admitting: Internal Medicine

## 2022-12-09 DIAGNOSIS — F900 Attention-deficit hyperactivity disorder, predominantly inattentive type: Secondary | ICD-10-CM

## 2022-12-09 MED ORDER — AMPHETAMINE-DEXTROAMPHET ER 30 MG PO CP24: 30.0000 mg | ORAL_CAPSULE | ORAL | 0 refills | Status: DC

## 2023-01-21 DIAGNOSIS — M2242 Chondromalacia patellae, left knee: Secondary | ICD-10-CM | POA: Diagnosis not present

## 2023-01-28 ENCOUNTER — Ambulatory Visit: Payer: BC Managed Care – PPO | Admitting: Internal Medicine

## 2023-01-28 ENCOUNTER — Other Ambulatory Visit: Payer: Self-pay | Admitting: Internal Medicine

## 2023-01-28 ENCOUNTER — Encounter: Payer: Self-pay | Admitting: Internal Medicine

## 2023-01-28 VITALS — BP 110/80 | HR 118 | Temp 98.1°F | Wt 219.5 lb

## 2023-01-28 DIAGNOSIS — R5383 Other fatigue: Secondary | ICD-10-CM | POA: Diagnosis not present

## 2023-01-28 DIAGNOSIS — E559 Vitamin D deficiency, unspecified: Secondary | ICD-10-CM

## 2023-01-28 DIAGNOSIS — F900 Attention-deficit hyperactivity disorder, predominantly inattentive type: Secondary | ICD-10-CM

## 2023-01-28 DIAGNOSIS — I10 Essential (primary) hypertension: Secondary | ICD-10-CM | POA: Diagnosis not present

## 2023-01-28 MED ORDER — AMPHETAMINE-DEXTROAMPHETAMINE 10 MG PO TABS: ORAL_TABLET | ORAL | 0 refills | Status: DC

## 2023-01-28 MED ORDER — AMPHETAMINE-DEXTROAMPHET ER 30 MG PO CP24: 30.0000 mg | ORAL_CAPSULE | ORAL | 0 refills | Status: DC

## 2023-01-28 NOTE — Progress Notes (Signed)
Established Patient Office Visit     CC/Reason for Visit: Follow-up chronic conditions  HPI: Walter Roberson is a 37 y.o. male who is coming in today for the above mentioned reasons. Past Medical History is significant for: ADHD, obesity, vitamin D deficiency.  He continues to have issues with fatigue.  He does snore and feels tired during the day.  We had discussed increasing his Adderall dose.  He has never had a sleep study.   Past Medical/Surgical History: Past Medical History:  Diagnosis Date   ADHD    HTN (hypertension)    Overweight (BMI 25.0-29.9)     Past Surgical History:  Procedure Laterality Date   CARPAL TUNNEL RELEASE Right    CHONDROPLASTY Left 09/10/2021   Procedure: CHONDROPLASTY;  Surgeon: Hiram Gash, MD;  Location: [place];  Service: Orthopedics;  Laterality: Left;   FINGER CLOSED REDUCTION     KNEE ARTHROSCOPY Left 09/10/2021   Procedure: ARTHROSCOPY KNEE WITH LOOSE BODY EXCISION;  Surgeon: Hiram Gash, MD;  Location: [place];  Service: Orthopedics;  Laterality: Left;   KNEE ARTHROSCOPY Left 07/23/2022   Procedure: ARTHROSCOPY KNEE CHONDROPLASTY;  Surgeon: Hiram Gash, MD;  Location: [place];  Service: Orthopedics;  Laterality: Left;   KNEE ARTHROSCOPY WITH OSTEOCHONDRAL DEFECT REPAIR Left 03/01/2019   Procedure: LEFT KNEE ARTHROSCOPY WITH OSTEOCHARAL DEFECT FIXATION MICROFRACTURE;  Surgeon: Vickey Huger, MD;  Location: [place];  Service: Orthopedics;  Laterality: Left;   ORIF ANKLE FRACTURE BIMALLEOLAR Left    x2   OSTEOCHONDRAL DEFECT REPAIR/RECONSTRUCTION Left 09/10/2021   Procedure: OSTEOCHONDRAL DEFECT REPAIR/RECONSTRUCTION;  Surgeon: Hiram Gash, MD;  Location: [place];  Service: Orthopedics;  Laterality: Left;   REPAIR ANKLE LIGAMENT Left     Social History:  reports that he has never smoked. He has never used smokeless tobacco. He reports current  alcohol use of about 4.0 standard drinks of alcohol per week. He reports that he does not use drugs.  Allergies: No Known Allergies  Family History:  Family History  Problem Relation Age of Onset   Hypertension Father    Diabetes Brother      Current Outpatient Medications:    amphetamine-dextroamphetamine (ADDERALL) 10 MG tablet, Take 1 tablet at 2 pm if needed., Disp: 30 tablet, Rfl: 0   amphetamine-dextroamphetamine (ADDERALL) 10 MG tablet, Take 1 tablet at 2 pm if needed., Disp: 30 tablet, Rfl: 0   amphetamine-dextroamphetamine (ADDERALL) 10 MG tablet, Take 1 tablet at 2 pm if needed., Disp: 30 tablet, Rfl: 0   hydrochlorothiazide (HYDRODIURIL) 25 MG tablet, Take 1 tablet (25 mg total) by mouth daily., Disp: 90 tablet, Rfl: 1   lisinopril (ZESTRIL) 10 MG tablet, TAKE 1 TABLET BY MOUTH EVERY DAY, Disp: 90 tablet, Rfl: 0   meloxicam (MOBIC) 15 MG tablet, Take 1 tablet (15 mg total) by mouth daily. For 2 weeks for pain and inflammation. Then take as needed, Disp: 30 tablet, Rfl: 0   methocarbamol (ROBAXIN) 500 MG tablet, Take 1 tablet (500 mg total) by mouth every 8 (eight) hours as needed for muscle spasms., Disp: 30 tablet, Rfl: 0   amphetamine-dextroamphetamine (ADDERALL XR) 30 MG 24 hr capsule, Take 1 capsule (30 mg total) by mouth every morning., Disp: 30 capsule, Rfl: 0   amphetamine-dextroamphetamine (ADDERALL XR) 30 MG 24 hr capsule, Take 1 capsule (30 mg total) by mouth every morning., Disp: 30 capsule, Rfl: 0   amphetamine-dextroamphetamine (ADDERALL  XR) 30 MG 24 hr capsule, Take 1 capsule (30 mg total) by mouth every morning., Disp: 30 capsule, Rfl: 0  Review of Systems:  Negative unless indicated in HPI.   Physical Exam: Vitals:   01/28/23 1526  BP: 110/80  Pulse: (!) 118  Temp: 98.1 F (36.7 C)  TempSrc: Oral  SpO2: 99%  Weight: 219 lb 8 oz (99.6 kg)    Body mass index is 33.37 kg/m.   Physical Exam Vitals reviewed.  Constitutional:      Appearance:  Normal appearance.  HENT:     Head: Normocephalic and atraumatic.  Eyes:     Conjunctiva/sclera: Conjunctivae normal.     Pupils: Pupils are equal, round, and reactive to light.  Cardiovascular:     Rate and Rhythm: Normal rate and regular rhythm.  Pulmonary:     Effort: Pulmonary effort is normal.     Breath sounds: Normal breath sounds.  Skin:    General: Skin is warm and dry.  Neurological:     General: No focal deficit present.     Mental Status: He is alert and oriented to person, place, and time.  Psychiatric:        Mood and Affect: Mood normal.        Behavior: Behavior normal.        Thought Content: Thought content normal.        Judgment: Judgment normal.      Impression and Plan:  Attention deficit hyperactivity disorder (ADHD), predominantly inattentive type - Plan: amphetamine-dextroamphetamine (ADDERALL) 10 MG tablet, amphetamine-dextroamphetamine (ADDERALL) 10 MG tablet, amphetamine-dextroamphetamine (ADDERALL) 10 MG tablet, amphetamine-dextroamphetamine (ADDERALL XR) 30 MG 24 hr capsule, amphetamine-dextroamphetamine (ADDERALL XR) 30 MG 24 hr capsule, amphetamine-dextroamphetamine (ADDERALL XR) 30 MG 24 hr capsule  Primary hypertension  Fatigue, unspecified type - Plan: Ambulatory referral to Neurology  -PDMP reviewed, no red flags, overdose risk score of 340.  ORS is elevated due to combination of Adderall with Norco that he has been getting for his knee surgery.  He is no longer on Norco. -Refill Adderall XR 30 mg to take 1 tablet in the morning for total of 30 tablets a month x 3 months.  Due to his increasing fatigue I will add a second Adderall 10 mg to take at around 2 PM as needed for total of 30 tablets a month x 3 months. -Referral to neurology for sleep study. -Blood pressure remains well-controlled.  Time spent:30 minutes reviewing chart, interviewing and examining patient and formulating plan of care.     Lelon Frohlich, MD [place]  [facility] Care at [facility]

## 2023-02-08 ENCOUNTER — Encounter: Payer: Self-pay | Admitting: Internal Medicine

## 2023-02-26 ENCOUNTER — Other Ambulatory Visit: Payer: Self-pay | Admitting: Internal Medicine

## 2023-02-26 DIAGNOSIS — F900 Attention-deficit hyperactivity disorder, predominantly inattentive type: Secondary | ICD-10-CM

## 2023-02-28 ENCOUNTER — Other Ambulatory Visit: Payer: Self-pay | Admitting: Internal Medicine

## 2023-02-28 DIAGNOSIS — I10 Essential (primary) hypertension: Secondary | ICD-10-CM

## 2023-03-09 DIAGNOSIS — M5415 Radiculopathy, thoracolumbar region: Secondary | ICD-10-CM | POA: Diagnosis not present

## 2023-03-09 DIAGNOSIS — M5417 Radiculopathy, lumbosacral region: Secondary | ICD-10-CM | POA: Diagnosis not present

## 2023-03-09 DIAGNOSIS — M9903 Segmental and somatic dysfunction of lumbar region: Secondary | ICD-10-CM | POA: Diagnosis not present

## 2023-03-09 DIAGNOSIS — M5418 Radiculopathy, sacral and sacrococcygeal region: Secondary | ICD-10-CM | POA: Diagnosis not present

## 2023-03-10 ENCOUNTER — Encounter: Payer: Self-pay | Admitting: Internal Medicine

## 2023-03-12 DIAGNOSIS — M5418 Radiculopathy, sacral and sacrococcygeal region: Secondary | ICD-10-CM | POA: Diagnosis not present

## 2023-03-12 DIAGNOSIS — M5415 Radiculopathy, thoracolumbar region: Secondary | ICD-10-CM | POA: Diagnosis not present

## 2023-03-12 DIAGNOSIS — M9903 Segmental and somatic dysfunction of lumbar region: Secondary | ICD-10-CM | POA: Diagnosis not present

## 2023-03-12 DIAGNOSIS — M5417 Radiculopathy, lumbosacral region: Secondary | ICD-10-CM | POA: Diagnosis not present

## 2023-04-09 ENCOUNTER — Encounter: Payer: Self-pay | Admitting: Internal Medicine

## 2023-04-09 DIAGNOSIS — F900 Attention-deficit hyperactivity disorder, predominantly inattentive type: Secondary | ICD-10-CM

## 2023-04-12 MED ORDER — AMPHETAMINE-DEXTROAMPHETAMINE 10 MG PO TABS: ORAL_TABLET | ORAL | 0 refills | Status: DC

## 2023-04-12 MED ORDER — AMPHETAMINE-DEXTROAMPHET ER 30 MG PO CP24: 30.0000 mg | ORAL_CAPSULE | ORAL | 0 refills | Status: DC

## 2023-04-14 ENCOUNTER — Other Ambulatory Visit: Payer: Self-pay | Admitting: Internal Medicine

## 2023-04-14 DIAGNOSIS — E559 Vitamin D deficiency, unspecified: Secondary | ICD-10-CM

## 2023-04-27 DIAGNOSIS — S93401A Sprain of unspecified ligament of right ankle, initial encounter: Secondary | ICD-10-CM | POA: Diagnosis not present

## 2023-04-27 DIAGNOSIS — M25571 Pain in right ankle and joints of right foot: Secondary | ICD-10-CM | POA: Diagnosis not present

## 2023-04-27 DIAGNOSIS — M25471 Effusion, right ankle: Secondary | ICD-10-CM | POA: Diagnosis not present

## 2023-05-28 ENCOUNTER — Other Ambulatory Visit: Payer: Self-pay | Admitting: Internal Medicine

## 2023-05-28 DIAGNOSIS — I10 Essential (primary) hypertension: Secondary | ICD-10-CM

## 2023-06-03 ENCOUNTER — Encounter: Payer: Self-pay | Admitting: Internal Medicine

## 2023-06-11 DIAGNOSIS — M2242 Chondromalacia patellae, left knee: Secondary | ICD-10-CM | POA: Diagnosis not present

## 2023-06-29 ENCOUNTER — Encounter: Payer: Self-pay | Admitting: Internal Medicine

## 2023-06-29 DIAGNOSIS — F900 Attention-deficit hyperactivity disorder, predominantly inattentive type: Secondary | ICD-10-CM

## 2023-06-30 MED ORDER — AMPHETAMINE-DEXTROAMPHETAMINE 10 MG PO TABS: ORAL_TABLET | ORAL | 0 refills | Status: DC

## 2023-07-05 ENCOUNTER — Encounter: Payer: Self-pay | Admitting: Internal Medicine

## 2023-07-05 ENCOUNTER — Telehealth (INDEPENDENT_AMBULATORY_CARE_PROVIDER_SITE_OTHER): Payer: BC Managed Care – PPO | Admitting: Internal Medicine

## 2023-07-05 DIAGNOSIS — F900 Attention-deficit hyperactivity disorder, predominantly inattentive type: Secondary | ICD-10-CM | POA: Diagnosis not present

## 2023-07-05 MED ORDER — AMPHETAMINE-DEXTROAMPHET ER 30 MG PO CP24: 30.0000 mg | ORAL_CAPSULE | ORAL | 0 refills | Status: DC

## 2023-07-05 MED ORDER — AMPHETAMINE-DEXTROAMPHETAMINE 10 MG PO TABS: ORAL_TABLET | ORAL | 0 refills | Status: DC

## 2023-07-05 NOTE — Progress Notes (Signed)
 Per patient no change in vitals since last visit, unable to obtain new vitals due to telehealth visit

## 2023-07-05 NOTE — Progress Notes (Signed)
Virtual Visit via Video Note  I connected with Walter Roberson on 07/05/23 at  1:30 PM EDT by a video enabled telemedicine application and verified that I am speaking with the correct person using two identifiers.  Location patient: home Location provider: work office Persons participating in the virtual visit: patient, provider  I discussed the limitations of evaluation and management by telemedicine and the availability of in person appointments. The patient expressed understanding and agreed to proceed.   HPI: Scheduled visit is for the purpose of Adderall refills.  Tolerating current dose well.   ROS: Negative unless indicated in HPI.  Past Medical History:  Diagnosis Date   ADHD    HTN (hypertension)    Overweight (BMI 25.0-29.9)     Past Surgical History:  Procedure Laterality Date   CARPAL TUNNEL RELEASE Right    CHONDROPLASTY Left 09/10/2021   Procedure: CHONDROPLASTY;  Surgeon: Bjorn Pippin, MD;  Location: [facility];  Service: Orthopedics;  Laterality: Left;   FINGER CLOSED REDUCTION     KNEE ARTHROSCOPY Left 09/10/2021   Procedure: ARTHROSCOPY KNEE WITH LOOSE BODY EXCISION;  Surgeon: Bjorn Pippin, MD;  Location: [facility];  Service: Orthopedics;  Laterality: Left;   KNEE ARTHROSCOPY Left 07/23/2022   Procedure: ARTHROSCOPY KNEE CHONDROPLASTY;  Surgeon: Bjorn Pippin, MD;  Location: [facility];  Service: Orthopedics;  Laterality: Left;   KNEE ARTHROSCOPY WITH OSTEOCHONDRAL DEFECT REPAIR Left 03/01/2019   Procedure: LEFT KNEE ARTHROSCOPY WITH OSTEOCHARAL DEFECT FIXATION MICROFRACTURE;  Surgeon: Dannielle Huh, MD;  Location: [facility];  Service: Orthopedics;  Laterality: Left;   ORIF ANKLE FRACTURE BIMALLEOLAR Left    x2   OSTEOCHONDRAL DEFECT REPAIR/RECONSTRUCTION Left 09/10/2021   Procedure: OSTEOCHONDRAL DEFECT REPAIR/RECONSTRUCTION;  Surgeon: Bjorn Pippin, MD;  Location: [facility];   Service: Orthopedics;  Laterality: Left;   REPAIR ANKLE LIGAMENT Left     Family History  Problem Relation Age of Onset   Hypertension Father    Diabetes Brother     SOCIAL HX:   reports that he has never smoked. He has never used smokeless tobacco. He reports current alcohol use of about 4.0 standard drinks of alcohol per week. He reports that he does not use drugs.   Current Outpatient Medications:    amphetamine-dextroamphetamine (ADDERALL XR) 30 MG 24 hr capsule, Take 1 capsule (30 mg total) by mouth every morning., Disp: 30 capsule, Rfl: 0   amphetamine-dextroamphetamine (ADDERALL XR) 30 MG 24 hr capsule, Take 1 capsule (30 mg total) by mouth every morning., Disp: 30 capsule, Rfl: 0   amphetamine-dextroamphetamine (ADDERALL XR) 30 MG 24 hr capsule, Take 1 capsule (30 mg total) by mouth every morning., Disp: 30 capsule, Rfl: 0   amphetamine-dextroamphetamine (ADDERALL) 10 MG tablet, Take 1 tablet at 2 pm if needed., Disp: 30 tablet, Rfl: 0   amphetamine-dextroamphetamine (ADDERALL) 10 MG tablet, Take 1 tablet at 2 pm if needed., Disp: 30 tablet, Rfl: 0   amphetamine-dextroamphetamine (ADDERALL) 10 MG tablet, Take 1 tablet at 2 pm if needed., Disp: 30 tablet, Rfl: 0   hydrochlorothiazide (HYDRODIURIL) 25 MG tablet, TAKE 1 TABLET (25 MG TOTAL) BY MOUTH DAILY., Disp: 90 tablet, Rfl: 0   lisinopril (ZESTRIL) 10 MG tablet, TAKE 1 TABLET BY MOUTH EVERY DAY, Disp: 90 tablet, Rfl: 1   meloxicam (MOBIC) 15 MG tablet, Take 1 tablet (15 mg total) by mouth daily. For 2 weeks for pain and inflammation. Then take as  needed, Disp: 30 tablet, Rfl: 0   methocarbamol (ROBAXIN) 500 MG tablet, Take 1 tablet (500 mg total) by mouth every 8 (eight) hours as needed for muscle spasms., Disp: 30 tablet, Rfl: 0  EXAM:   VITALS per patient if applicable: None reported  GENERAL: alert, oriented, appears well and in no acute distress  HEENT: atraumatic, conjunttiva clear, no obvious abnormalities on  inspection of external nose and ears  NECK: normal movements of the head and neck  LUNGS: on inspection no signs of respiratory distress, breathing rate appears normal, no obvious gross increased work of breathing, gasping or wheezing  CV: no obvious cyanosis  MS: moves all visible extremities without noticeable abnormality  PSYCH/NEURO: pleasant and cooperative, no obvious depression or anxiety, speech and thought processing grossly intact  ASSESSMENT AND PLAN:   Attention deficit hyperactivity disorder (ADHD), predominantly inattentive type - Plan: amphetamine-dextroamphetamine (ADDERALL) 10 MG tablet, amphetamine-dextroamphetamine (ADDERALL XR) 30 MG 24 hr capsule, amphetamine-dextroamphetamine (ADDERALL) 10 MG tablet, amphetamine-dextroamphetamine (ADDERALL XR) 30 MG 24 hr capsule, amphetamine-dextroamphetamine (ADDERALL XR) 30 MG 24 hr capsule  -PDMP reviewed, no red flags, overdose risk score is 240. -Refill Adderall XR 30 mg to take 1 tablet a day for a total of 30 tablets a month x 3 months, I will also refill Adderall immediate release 10 mg to take 1 tablet in the afternoons if needed for a total of 30 tablets a month x 3 months.   I discussed the assessment and treatment plan with the patient. The patient was provided an opportunity to ask questions and all were answered. The patient agreed with the plan and demonstrated an understanding of the instructions.   The patient was advised to call back or seek an in-person evaluation if the symptoms worsen or if the condition fails to improve as anticipated.    Chaya Jan, MD  [facility] at [facility]

## 2023-07-09 ENCOUNTER — Encounter: Payer: Self-pay | Admitting: Internal Medicine

## 2023-07-09 DIAGNOSIS — F900 Attention-deficit hyperactivity disorder, predominantly inattentive type: Secondary | ICD-10-CM

## 2023-07-15 MED ORDER — AMPHETAMINE-DEXTROAMPHET ER 30 MG PO CP24
30.0000 mg | ORAL_CAPSULE | ORAL | 0 refills | Status: DC
Start: 2023-07-15 — End: 2023-08-18

## 2023-07-15 NOTE — Telephone Encounter (Signed)
Pt is calling and [facility] has amphetamine-dextroamphetamine (ADDERALL XR) 30 MG 24 hr capsule in stock  [id number] - , [place] - [address] AT [facility] OF [address] Phone: [phone]  Fax: [phone]

## 2023-08-04 ENCOUNTER — Encounter: Payer: Self-pay | Admitting: Internal Medicine

## 2023-08-12 ENCOUNTER — Encounter: Payer: Self-pay | Admitting: Internal Medicine

## 2023-08-18 ENCOUNTER — Other Ambulatory Visit: Payer: Self-pay | Admitting: Internal Medicine

## 2023-08-18 DIAGNOSIS — F900 Attention-deficit hyperactivity disorder, predominantly inattentive type: Secondary | ICD-10-CM

## 2023-08-18 MED ORDER — AMPHETAMINE-DEXTROAMPHET ER 30 MG PO CP24
30.0000 mg | ORAL_CAPSULE | ORAL | 0 refills | Status: DC
Start: 2023-08-18 — End: 2023-10-20

## 2023-08-18 NOTE — Telephone Encounter (Signed)
Prescription Request  08/18/2023  LOV: 01/28/2023  What is the name of the medication or equipment? amphetamine-dextroamphetamine (ADDERALL XR) 30 MG 24 hr capsule   Have you contacted your pharmacy to request a refill? No   Which pharmacy would you like this sent to?  [id number] - [place], [place] - [address] AT [facility] OF [address] [place] [address] Phone: [phone] Fax: [phone]     Patient notified that their request is being sent to the clinical staff for review and that they should receive a response within 2 business days.   Please advise at Mobile [phone] (mobile)

## 2023-08-29 ENCOUNTER — Other Ambulatory Visit: Payer: Self-pay | Admitting: Internal Medicine

## 2023-08-29 DIAGNOSIS — I10 Essential (primary) hypertension: Secondary | ICD-10-CM

## 2023-08-31 DIAGNOSIS — M2242 Chondromalacia patellae, left knee: Secondary | ICD-10-CM | POA: Diagnosis not present

## 2023-09-13 ENCOUNTER — Telehealth: Payer: Self-pay | Admitting: Internal Medicine

## 2023-09-13 NOTE — Telephone Encounter (Signed)
Prescription Request  09/13/2023  LOV: 01/28/2023  What is the name of the medication or equipment? amphetamine-dextroamphetamine (ADDERALL XR) 30 MG 24 hr capsule , amphetamine-dextroamphetamine (ADDERALL) 10 MG tablet   Have you contacted your pharmacy to request a refill? No   Which pharmacy would you like this sent to?  [id number] - [place], [place] - [address] AT [facility] [address] [place] [address] Phone: [phone] Fax: [phone]     Patient notified that their request is being sent to the clinical staff for review and that they should receive a response within 2 business days.   Please advise at Mobile [phone] (mobile)

## 2023-09-15 NOTE — Telephone Encounter (Signed)
Spoke to pharmacist and Adderall 10 is in process and should be ready this afternoon.  Adderall 30 XR will be ready 09/17/23. Left detailed message on machine for patient.

## 2023-10-11 ENCOUNTER — Other Ambulatory Visit: Payer: Self-pay | Admitting: Internal Medicine

## 2023-10-11 DIAGNOSIS — F900 Attention-deficit hyperactivity disorder, predominantly inattentive type: Secondary | ICD-10-CM

## 2023-10-11 NOTE — Telephone Encounter (Signed)
Prescription Request  10/11/2023  LOV: 01/28/2023  What is the name of the medication or equipment? amphetamine-dextroamphetamine (ADDERALL XR) 30 MG 24 hr capsule   amphetamine-dextroamphetamine (ADDERALL) 10 MG tablet    Have you contacted your pharmacy to request a refill? No   Which pharmacy would you like this sent to?  [id number] - [place], [place] - [address] AT [facility] [address] [place] [address] Phone: [phone] Fax: [phone]      Patient notified that their request is being sent to the clinical staff for review and that they should receive a response within 2 business days.   Please advise at Mobile [phone] (mobile)

## 2023-10-11 NOTE — Telephone Encounter (Signed)
Last refill was given at [facility] 07/05/23.  Okay to refill?

## 2023-10-12 MED ORDER — AMPHETAMINE-DEXTROAMPHETAMINE 10 MG PO TABS
ORAL_TABLET | ORAL | 0 refills | Status: DC
Start: 2023-10-12 — End: 2023-10-20

## 2023-10-12 NOTE — Telephone Encounter (Signed)
Appointment scheduled for 10/21/23

## 2023-10-12 NOTE — Addendum Note (Signed)
Addended by: Kern Reap B on: 10/12/2023 08:54 AM   Modules accepted: Orders

## 2023-10-12 NOTE — Telephone Encounter (Signed)
Refill sent.

## 2023-10-21 ENCOUNTER — Telehealth: Payer: BC Managed Care – PPO | Admitting: Internal Medicine

## 2023-10-21 ENCOUNTER — Encounter: Payer: Self-pay | Admitting: Internal Medicine

## 2023-10-21 DIAGNOSIS — F900 Attention-deficit hyperactivity disorder, predominantly inattentive type: Secondary | ICD-10-CM

## 2023-10-21 MED ORDER — AMPHETAMINE-DEXTROAMPHETAMINE 10 MG PO TABS: ORAL_TABLET | ORAL | 0 refills | Status: DC

## 2023-10-21 MED ORDER — AMPHETAMINE-DEXTROAMPHET ER 30 MG PO CP24
30.0000 mg | ORAL_CAPSULE | ORAL | 0 refills | Status: DC
Start: 2023-10-21 — End: 2024-01-20

## 2023-10-21 MED ORDER — AMPHETAMINE-DEXTROAMPHET ER 30 MG PO CP24: 30.0000 mg | ORAL_CAPSULE | ORAL | 0 refills | Status: DC

## 2023-10-21 NOTE — Progress Notes (Signed)
Virtual Visit via Video Note  I connected with Walter Roberson on 10/21/23 at  8:00 AM EST by a video enabled telemedicine application and verified that I am speaking with the correct person using two identifiers.  Location patient: home Location provider: work office Persons participating in the virtual visit: patient, provider  I discussed the limitations of evaluation and management by telemedicine and the availability of in person appointments. The patient expressed understanding and agreed to proceed.   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  No acute complaints other than difficulty sleeping.   ROS: Negative unless indicated in HPI.  Past Medical History:  Diagnosis Date   ADHD    HTN (hypertension)    Overweight (BMI 25.0-29.9)     Past Surgical History:  Procedure Laterality Date   CARPAL TUNNEL RELEASE Right    CHONDROPLASTY Left 09/10/2021   Procedure: CHONDROPLASTY;  Surgeon: Bjorn Pippin, MD;  Location: [facility];  Service: Orthopedics;  Laterality: Left;   FINGER CLOSED REDUCTION     KNEE ARTHROSCOPY Left 09/10/2021   Procedure: ARTHROSCOPY KNEE WITH LOOSE BODY EXCISION;  Surgeon: Bjorn Pippin, MD;  Location: [facility];  Service: Orthopedics;  Laterality: Left;   KNEE ARTHROSCOPY Left 07/23/2022   Procedure: ARTHROSCOPY KNEE CHONDROPLASTY;  Surgeon: Bjorn Pippin, MD;  Location: [facility];  Service: Orthopedics;  Laterality: Left;   KNEE ARTHROSCOPY WITH OSTEOCHONDRAL DEFECT REPAIR Left 03/01/2019   Procedure: LEFT KNEE ARTHROSCOPY WITH OSTEOCHARAL DEFECT FIXATION MICROFRACTURE;  Surgeon: Dannielle Huh, MD;  Location: [facility];  Service: Orthopedics;  Laterality: Left;   ORIF ANKLE FRACTURE BIMALLEOLAR Left    x2   OSTEOCHONDRAL DEFECT REPAIR/RECONSTRUCTION Left 09/10/2021   Procedure: OSTEOCHONDRAL DEFECT REPAIR/RECONSTRUCTION;  Surgeon: Bjorn Pippin, MD;   Location: [facility];  Service: Orthopedics;  Laterality: Left;   REPAIR ANKLE LIGAMENT Left     Family History  Problem Relation Age of Onset   Hypertension Father    Diabetes Brother     SOCIAL HX:   reports that he has never smoked. He has never used smokeless tobacco. He reports current alcohol use of about 4.0 standard drinks of alcohol per week. He reports that he does not use drugs.   Current Outpatient Medications:    hydrochlorothiazide (HYDRODIURIL) 25 MG tablet, TAKE 1 TABLET (25 MG TOTAL) BY MOUTH DAILY., Disp: 90 tablet, Rfl: 0   lisinopril (ZESTRIL) 10 MG tablet, TAKE 1 TABLET BY MOUTH EVERY DAY, Disp: 90 tablet, Rfl: 1   meloxicam (MOBIC) 15 MG tablet, Take 1 tablet (15 mg total) by mouth daily. For 2 weeks for pain and inflammation. Then take as needed, Disp: 30 tablet, Rfl: 0   methocarbamol (ROBAXIN) 500 MG tablet, Take 1 tablet (500 mg total) by mouth every 8 (eight) hours as needed for muscle spasms., Disp: 30 tablet, Rfl: 0   amphetamine-dextroamphetamine (ADDERALL XR) 30 MG 24 hr capsule, Take 1 capsule (30 mg total) by mouth every morning., Disp: 30 capsule, Rfl: 0   amphetamine-dextroamphetamine (ADDERALL XR) 30 MG 24 hr capsule, Take 1 capsule (30 mg total) by mouth every morning., Disp: 30 capsule, Rfl: 0   amphetamine-dextroamphetamine (ADDERALL XR) 30 MG 24 hr capsule, Take 1 capsule (30 mg total) by mouth every morning., Disp: 30 capsule, Rfl: 0   amphetamine-dextroamphetamine (ADDERALL) 10 MG tablet, Take 1 tablet at 2 pm if needed., Disp: 30 tablet, Rfl: 0  amphetamine-dextroamphetamine (ADDERALL) 10 MG tablet, Take 1 tablet at 2 pm if needed., Disp: 30 tablet, Rfl: 0   amphetamine-dextroamphetamine (ADDERALL) 10 MG tablet, Take 1 tablet at 2 pm if needed., Disp: 30 tablet, Rfl: 0  EXAM:   VITALS per patient if applicable: None reported  GENERAL: alert, oriented, appears well and in no acute distress  HEENT: atraumatic, conjunttiva clear,  no obvious abnormalities on inspection of external nose and ears  NECK: normal movements of the head and neck  LUNGS: on inspection no signs of respiratory distress, breathing rate appears normal, no obvious gross increased work of breathing, gasping or wheezing  CV: no obvious cyanosis  MS: moves all visible extremities without noticeable abnormality  PSYCH/NEURO: pleasant and cooperative, no obvious depression or anxiety, speech and thought processing grossly intact  ASSESSMENT AND PLAN:   Attention deficit hyperactivity disorder (ADHD), predominantly inattentive type - Plan: amphetamine-dextroamphetamine (ADDERALL XR) 30 MG 24 hr capsule, amphetamine-dextroamphetamine (ADDERALL XR) 30 MG 24 hr capsule, amphetamine-dextroamphetamine (ADDERALL XR) 30 MG 24 hr capsule, amphetamine-dextroamphetamine (ADDERALL) 10 MG tablet, amphetamine-dextroamphetamine (ADDERALL) 10 MG tablet, amphetamine-dextroamphetamine (ADDERALL) 10 MG tablet  -PDMP reviewed, no red flags, overdose risk score is 340. -Refill Adderall XR 30 mg to take 1 tablet in the morning for 30 tablets a month x 3 months. -Refill Adderall 10 mg to take 1 tablet at 2:00 in the afternoon if needed. -He will start wearing a sleep tracker to bed to better assess his sleep patterns.   I discussed the assessment and treatment plan with the patient. The patient was provided an opportunity to ask questions and all were answered. The patient agreed with the plan and demonstrated an understanding of the instructions.   The patient was advised to call back or seek an in-person evaluation if the symptoms worsen or if the condition fails to improve as anticipated.    Chaya Jan, MD  [facility] at [facility]

## 2023-10-21 NOTE — Progress Notes (Signed)
 Per patient no change in vitals since last visit, unable to obtain new vitals due to telehealth visit

## 2023-11-22 ENCOUNTER — Other Ambulatory Visit: Payer: Self-pay | Admitting: Internal Medicine

## 2023-11-22 DIAGNOSIS — F900 Attention-deficit hyperactivity disorder, predominantly inattentive type: Secondary | ICD-10-CM

## 2023-11-22 NOTE — Telephone Encounter (Signed)
 Copied from CRM [id number]. Topic: Clinical - Medication Refill >> Nov 22, 2023 11:05 AM Robinson DEL wrote: Most Recent Primary Care Visit:  Provider: THEOPHILUS DELMA TULLY Roberson  Department: [facility]  Visit Type: MYCHART VIDEO VISIT  Date: 10/21/2023  Medication: amphetamine -dextroamphetamine  (ADDERALL XR) 30 MG 24 hr capsule, amphetamine -dextroamphetamine  (ADDERALL) 10 MG tablet  Has the patient contacted their pharmacy? No, provider authorization needed (Agent: If no, request that the patient contact the pharmacy for the refill. If patient does not wish to contact the pharmacy document the reason why and proceed with request.) (Agent: If yes, when and what did the pharmacy advise?)  Is this the correct pharmacy for this prescription? Yes If no, delete pharmacy and type the correct one.  This is the patient's preferred pharmacy:  [id number] - [place], [place] - [address] AT [facility] [address] [place] [address] Phone: [phone] Fax: [phone]    Has the prescription been filled recently? Yes, 10/2023  Is the patient out of the medication? Yes  Has the patient been seen for an appointment in the last year OR does the patient have an upcoming appointment? Yes  Can we respond through MyChart? Yes  Agent: Please be advised that Rx refills may take up to 3 business days. We ask that you follow-up with your pharmacy.

## 2023-11-22 NOTE — Telephone Encounter (Signed)
 Spoke to the pharmacist and refills will be ready today.

## 2023-11-22 NOTE — Telephone Encounter (Signed)
 Please deny. Refill was sent   10/21/2023   thanks

## 2023-11-22 NOTE — Telephone Encounter (Signed)
 Copied from CRM [id number]. Topic: Clinical - Medication Refill >> Nov 22, 2023 11:05 AM Robinson DEL wrote: Most Recent Primary Care Visit:  Provider: THEOPHILUS DELMA TULLY CINDERELLA  Department: [facility]  Visit Type: MYCHART VIDEO VISIT  Date: 10/21/2023  Medication: amphetamine -dextroamphetamine  (ADDERALL XR) 30 MG 24 hr capsule, amphetamine -dextroamphetamine  (ADDERALL) 10 MG tablet  Has the patient contacted their pharmacy? No, provider authorization needed (Agent: If no, request that the patient contact the pharmacy for the refill. If patient does not wish to contact the pharmacy document the reason why and proceed with request.) (Agent: If yes, when and what did the pharmacy advise?)  Is this the correct pharmacy for this prescription? Yes If no, delete pharmacy and type the correct one.  This is the patient's preferred pharmacy:  [id number] - [place], [place] - [address] AT [facility] [address] [place] [address] Phone: [phone] Fax: [phone]    Has the prescription been filled recently? Yes, 10/2023  Is the patient out of the medication? Yes  Has the patient been seen for an appointment in the last year OR does the patient have an upcoming appointment?   Can we respond through MyChart?   Agent: Please be advised that Rx refills may take up to 3 business days. We ask that you follow-up with your pharmacy.

## 2023-11-29 ENCOUNTER — Other Ambulatory Visit: Payer: Self-pay | Admitting: Internal Medicine

## 2023-11-29 DIAGNOSIS — I10 Essential (primary) hypertension: Secondary | ICD-10-CM

## 2023-11-30 DIAGNOSIS — M2242 Chondromalacia patellae, left knee: Secondary | ICD-10-CM | POA: Diagnosis not present

## 2023-12-17 DIAGNOSIS — Z23 Encounter for immunization: Secondary | ICD-10-CM | POA: Diagnosis not present

## 2023-12-17 DIAGNOSIS — I10 Essential (primary) hypertension: Secondary | ICD-10-CM | POA: Diagnosis not present

## 2023-12-17 DIAGNOSIS — W540XXA Bitten by dog, initial encounter: Secondary | ICD-10-CM | POA: Diagnosis not present

## 2023-12-17 DIAGNOSIS — Z79899 Other long term (current) drug therapy: Secondary | ICD-10-CM | POA: Diagnosis not present

## 2023-12-17 DIAGNOSIS — S01551A Open bite of lip, initial encounter: Secondary | ICD-10-CM | POA: Insufficient documentation

## 2023-12-17 DIAGNOSIS — S0185XA Open bite of other part of head, initial encounter: Secondary | ICD-10-CM | POA: Diagnosis not present

## 2023-12-17 DIAGNOSIS — Z5321 Procedure and treatment not carried out due to patient leaving prior to being seen by health care provider: Secondary | ICD-10-CM | POA: Insufficient documentation

## 2023-12-18 ENCOUNTER — Ambulatory Visit (HOSPITAL_COMMUNITY)
Admission: EM | Admit: 2023-12-18 | Discharge: 2023-12-18 | Disposition: A | Discharge status: Home / Self Care | Payer: BC Managed Care – PPO

## 2023-12-18 ENCOUNTER — Encounter (HOSPITAL_COMMUNITY): Payer: Self-pay

## 2023-12-18 ENCOUNTER — Emergency Department (HOSPITAL_COMMUNITY)
Admission: EM | Admit: 2023-12-18 | Discharge: 2023-12-18 | Disposition: A | Discharge status: Home / Self Care | Payer: BC Managed Care – PPO | Attending: Emergency Medicine | Admitting: Emergency Medicine

## 2023-12-18 ENCOUNTER — Other Ambulatory Visit: Payer: Self-pay

## 2023-12-18 ENCOUNTER — Encounter (HOSPITAL_COMMUNITY): Payer: Self-pay | Admitting: *Deleted

## 2023-12-18 ENCOUNTER — Emergency Department (HOSPITAL_COMMUNITY)
Admission: EM | Admit: 2023-12-18 | Discharge: 2023-12-18 | Discharge status: Against Medical Advice | Payer: BC Managed Care – PPO | Attending: Emergency Medicine | Admitting: Emergency Medicine

## 2023-12-18 DIAGNOSIS — Z23 Encounter for immunization: Secondary | ICD-10-CM | POA: Insufficient documentation

## 2023-12-18 DIAGNOSIS — I10 Essential (primary) hypertension: Secondary | ICD-10-CM | POA: Insufficient documentation

## 2023-12-18 DIAGNOSIS — W540XXA Bitten by dog, initial encounter: Secondary | ICD-10-CM | POA: Insufficient documentation

## 2023-12-18 DIAGNOSIS — S0185XA Open bite of other part of head, initial encounter: Secondary | ICD-10-CM | POA: Insufficient documentation

## 2023-12-18 DIAGNOSIS — S01551A Open bite of lip, initial encounter: Secondary | ICD-10-CM | POA: Diagnosis not present

## 2023-12-18 DIAGNOSIS — Z79899 Other long term (current) drug therapy: Secondary | ICD-10-CM | POA: Insufficient documentation

## 2023-12-18 DIAGNOSIS — S0993XA Unspecified injury of face, initial encounter: Secondary | ICD-10-CM

## 2023-12-18 MED ORDER — TETANUS-DIPHTH-ACELL PERTUSSIS 5-2.5-18.5 LF-MCG/0.5 IM SUSY
0.5000 mL | PREFILLED_SYRINGE | Freq: Once | INTRAMUSCULAR | Status: AC
  Administered 2023-12-18: 0.5 mL via INTRAMUSCULAR
  Filled 2023-12-18: qty 0.5

## 2023-12-18 MED ORDER — AMOXICILLIN-POT CLAVULANATE 875-125 MG PO TABS: 1.0000 | ORAL_TABLET | Freq: Two times a day (BID) | ORAL | 0 refills | Status: AC

## 2023-12-18 NOTE — ED Provider Notes (Signed)
 Dog bite last night at a party Not sure who's dog it was. Reports he could not find out who it belonged to or if dog up to date on vaccines. Concerned that whoever's dog it was wouldn't want it to be taken by animal control. Has bite and/or scratches under left eye, cheek, and left lip RN evaluated and is not comfortable with immunoglobulin injection in these areas. Patient is advised to be seen in the ED for higher level of care, if he cannot find dog owner and have monitored.    Walter Roberson, [name], [place] 12/18/23 1601

## 2023-12-18 NOTE — ED Notes (Addendum)
 Pt called for room 3x, no response.

## 2023-12-18 NOTE — ED Provider Notes (Signed)
 [place] EMERGENCY DEPARTMENT AT [facility] Provider Note   CSN: [id number] Arrival date & time: 12/18/23  1558     History  Chief Complaint  Patient presents with   Animal Bite    Walter Roberson is a 38 y.o. male.  38 year old male with a history of hypertension who presents emergency department with a dog bite.  Patient reports that he was bitten by a German Shepherd yesterday at a party.  Happened at 11 PM.  Came to the emergency department but had to leave due to long wait times.  The dog is reportedly vaccinated.  It is a friend of a friend's dog.       Home Medications Prior to Admission medications   Medication Sig Start Date End Date Taking? Authorizing Provider  amoxicillin -clavulanate (AUGMENTIN ) 875-125 MG tablet Take 1 tablet by mouth every 12 (twelve) hours. 12/18/23  Yes Yolande Lamar BROCKS, MD  amphetamine -dextroamphetamine  (ADDERALL XR) 30 MG 24 hr capsule Take 1 capsule (30 mg total) by mouth every morning. 10/21/23   Theophilus Andrews, Tully GRADE, MD  amphetamine -dextroamphetamine  (ADDERALL XR) 30 MG 24 hr capsule Take 1 capsule (30 mg total) by mouth every morning. 10/21/23   Theophilus Andrews, Tully GRADE, MD  amphetamine -dextroamphetamine  (ADDERALL XR) 30 MG 24 hr capsule Take 1 capsule (30 mg total) by mouth every morning. 10/21/23   Theophilus Andrews, Tully GRADE, MD  amphetamine -dextroamphetamine  (ADDERALL) 10 MG tablet Take 1 tablet at 2 pm if needed. 10/21/23   Theophilus Andrews, Tully GRADE, MD  amphetamine -dextroamphetamine  (ADDERALL) 10 MG tablet Take 1 tablet at 2 pm if needed. 10/21/23   Theophilus Andrews, Tully GRADE, MD  amphetamine -dextroamphetamine  (ADDERALL) 10 MG tablet Take 1 tablet at 2 pm if needed. 10/21/23   Theophilus Andrews, Tully GRADE, MD  hydrochlorothiazide  (HYDRODIURIL ) 25 MG tablet TAKE 1 TABLET (25 MG TOTAL) BY MOUTH DAILY. 11/29/23   Theophilus Andrews, Tully GRADE, MD  lisinopril  (ZESTRIL ) 10 MG tablet TAKE 1 TABLET BY MOUTH EVERY DAY 08/30/23    Theophilus Andrews, Tully GRADE, MD  meloxicam  (MOBIC ) 15 MG tablet Take 1 tablet (15 mg total) by mouth daily. For 2 weeks for pain and inflammation. Then take as needed 07/23/22   McBane, Caroline N, PA-C  methocarbamol  (ROBAXIN ) 500 MG tablet Take 1 tablet (500 mg total) by mouth every 8 (eight) hours as needed for muscle spasms. 09/10/21   McBane, Caroline N, PA-C      Allergies    Patient has no known allergies.    Review of Systems   Review of Systems  Physical Exam Updated Vital Signs BP (!) 139/101 (BP Location: Right Arm)   Pulse 91   Temp (!) 97.3 F (36.3 C)   Resp 15   Ht 5' 8 (1.727 m)   Wt 83.5 kg   SpO2 97%   BMI 27.98 kg/m  Physical Exam Constitutional:      Appearance: Normal appearance.  Neurological:     Mental Status: He is alert.     ED Results / Procedures / Treatments   Labs (all labs ordered are listed, but only abnormal results are displayed) Labs Reviewed - No data to display  EKG None  Radiology No results found.  Procedures Procedures    Medications Ordered in ED Medications  Tdap (BOOSTRIX ) injection 0.5 mL (0.5 mLs Intramuscular Given 12/18/23 1825)    ED Course/ Medical Decision Making/ A&P  Medical Decision Making Risk Prescription drug management.   Walter Roberson is a 37 y.o. male with comorbidities that complicate the patient evaluation including hypertension who presents emergency department for dog bite  Initial Ddx:  Dog bite, cellulitis, rabies infection, tetanus  MDM/Course:  Patient presents to the emergency department after dog bite to the face.  Appears that the dog is vaccinated.  He does have some erythema and swelling around the fever dog bite wounds.  Will go ahead and treat him with Augmentin  to prevent any infections.  Tetanus shot was updated since he is unsure when his last Tdap was.  Did consider closure for the wounds but given the fact that they are small and the duration of  time that has elapsed since the bite feel that closure is not warranted.    This patient presents to the ED for concern of complaints listed in HPI, this involves an extensive number of treatment options, and is a complaint that carries with it a high risk of complications and morbidity. Disposition including potential need for admission considered.   Dispo: DC Home. Return precautions discussed including, but not limited to, those listed in the AVS. Allowed pt time to ask questions which were answered fully prior to dc.  Additional history obtained from spouse Records reviewed Outpatient Clinic Notes I have reviewed the patients home medications and made adjustments as needed  Portions of this note were generated with Dragon dictation software. Dictation errors may occur despite best attempts at proofreading.     Final Clinical Impression(s) / ED Diagnoses Final diagnoses:  Dog bite of face, initial encounter    Rx / DC Orders ED Discharge Orders          Ordered    amoxicillin -clavulanate (AUGMENTIN ) 875-125 MG tablet  Every 12 hours        12/18/23 1824              Yolande Lamar BROCKS, MD 12/19/23 1328

## 2023-12-18 NOTE — ED Triage Notes (Signed)
 The pt was bitten by a dog a little while ago  he has a bite to his bottom lip and his upper bite is acerated  bleeding controlled

## 2023-12-18 NOTE — ED Notes (Signed)
 ED Provider at bedside.

## 2023-12-18 NOTE — ED Triage Notes (Signed)
 Patient was bitten by a stray dog last night. Patient has bite to his upper and lower lip. Right eye scratch.

## 2023-12-18 NOTE — Discharge Instructions (Addendum)
 You were seen for a dog bite in the emergency department.   At home, please take the antibiotics (Augmentin ).    Check your MyChart online for the results of any tests that had not resulted by the time you left the emergency department.   Follow-up with your primary doctor in 2-3 days regarding your visit.    Return immediately to the emergency department if you experience any of the following: Pus draining from your wounds, fevers, or any other concerning symptoms.    Thank you for visiting our Emergency Department. It was a pleasure taking care of you today.

## 2023-12-18 NOTE — ED Triage Notes (Signed)
 Patient was bitten by a friend of a friends dog last night around 1130 pm last night.  Patient reports he is trying to find out about vaccine records and friend reports dog is vaccinated but will not give him the owners name.  Has cut to left upper lip and a few abrasions on left face.

## 2023-12-22 ENCOUNTER — Other Ambulatory Visit: Payer: Self-pay | Admitting: Internal Medicine

## 2023-12-22 ENCOUNTER — Encounter: Payer: Self-pay | Admitting: Internal Medicine

## 2023-12-22 DIAGNOSIS — F900 Attention-deficit hyperactivity disorder, predominantly inattentive type: Secondary | ICD-10-CM

## 2024-01-20 ENCOUNTER — Encounter: Payer: Self-pay | Admitting: Internal Medicine

## 2024-01-20 DIAGNOSIS — F900 Attention-deficit hyperactivity disorder, predominantly inattentive type: Secondary | ICD-10-CM

## 2024-01-24 MED ORDER — AMPHETAMINE-DEXTROAMPHETAMINE 10 MG PO TABS: ORAL_TABLET | ORAL | 0 refills | Status: DC

## 2024-01-24 MED ORDER — AMPHETAMINE-DEXTROAMPHET ER 30 MG PO CP24: 30.0000 mg | ORAL_CAPSULE | ORAL | 0 refills | Status: DC

## 2024-02-24 DIAGNOSIS — M2242 Chondromalacia patellae, left knee: Secondary | ICD-10-CM | POA: Diagnosis not present

## 2024-02-25 ENCOUNTER — Encounter: Payer: Self-pay | Admitting: Internal Medicine

## 2024-02-29 ENCOUNTER — Other Ambulatory Visit: Payer: Self-pay | Admitting: Internal Medicine

## 2024-02-29 DIAGNOSIS — I10 Essential (primary) hypertension: Secondary | ICD-10-CM

## 2024-03-02 ENCOUNTER — Encounter: Payer: Self-pay | Admitting: Internal Medicine

## 2024-03-02 ENCOUNTER — Telehealth (INDEPENDENT_AMBULATORY_CARE_PROVIDER_SITE_OTHER): Admitting: Internal Medicine

## 2024-03-02 DIAGNOSIS — F900 Attention-deficit hyperactivity disorder, predominantly inattentive type: Secondary | ICD-10-CM | POA: Diagnosis not present

## 2024-03-02 MED ORDER — AMPHETAMINE-DEXTROAMPHET ER 30 MG PO CP24: 30.0000 mg | ORAL_CAPSULE | ORAL | 0 refills | Status: DC

## 2024-03-02 MED ORDER — AMPHETAMINE-DEXTROAMPHETAMINE 10 MG PO TABS: ORAL_TABLET | ORAL | 0 refills | Status: DC

## 2024-03-02 NOTE — Progress Notes (Signed)
 Virtual Visit via Video Note  I connected with Walter Roberson on 03/02/24 at  1:30 PM EDT by a video enabled telemedicine application and verified that I am speaking with the correct person using two identifiers.  Location patient: home Location provider: work office Persons participating in the virtual visit: patient, provider  I discussed the limitations of evaluation and management by telemedicine and the availability of in person appointments. The patient expressed understanding and agreed to proceed.   HPI: He has scheduled this visit for the purpose of medication refills per contract.  He has a history of ADHD and is on Adderall.  Has been on the same doses for some time and is tolerating them well.   ROS: Negative unless indicated in HPI.  Past Medical History:  Diagnosis Date   ADHD    HTN (hypertension)    Overweight (BMI 25.0-29.9)     Past Surgical History:  Procedure Laterality Date   CARPAL TUNNEL RELEASE Right    CHONDROPLASTY Left 09/10/2021   Procedure: CHONDROPLASTY;  Surgeon: Micheline Ahr, MD;  Location: [facility];  Service: Orthopedics;  Laterality: Left;   FINGER CLOSED REDUCTION     KNEE ARTHROSCOPY Left 09/10/2021   Procedure: ARTHROSCOPY KNEE WITH LOOSE BODY EXCISION;  Surgeon: Micheline Ahr, MD;  Location: [facility];  Service: Orthopedics;  Laterality: Left;   KNEE ARTHROSCOPY Left 07/23/2022   Procedure: ARTHROSCOPY KNEE CHONDROPLASTY;  Surgeon: Micheline Ahr, MD;  Location: [facility];  Service: Orthopedics;  Laterality: Left;   KNEE ARTHROSCOPY WITH OSTEOCHONDRAL DEFECT REPAIR Left 03/01/2019   Procedure: LEFT KNEE ARTHROSCOPY WITH OSTEOCHARAL DEFECT FIXATION MICROFRACTURE;  Surgeon: Christie Cox, MD;  Location: [facility];  Service: Orthopedics;  Laterality: Left;   ORIF ANKLE FRACTURE BIMALLEOLAR Left    x2   OSTEOCHONDRAL DEFECT REPAIR/RECONSTRUCTION Left 09/10/2021   Procedure:  OSTEOCHONDRAL DEFECT REPAIR/RECONSTRUCTION;  Surgeon: Micheline Ahr, MD;  [facility];  Service: Orthopedics;  Laterality: Left;   REPAIR ANKLE LIGAMENT Left     Family History  Problem Relation Age of Onset   Hypertension Father    Diabetes Brother     SOCIAL HX:   reports that he has never smoked. He has never used smokeless tobacco. He reports current alcohol use of about 4.0 standard drinks of alcohol per week. He reports that he does not use drugs.   Current Outpatient Medications:    amoxicillin -clavulanate (AUGMENTIN ) 875-125 MG tablet, Take 1 tablet by mouth every 12 (twelve) hours., Disp: 14 tablet, Rfl: 0   hydrochlorothiazide  (HYDRODIURIL ) 25 MG tablet, TAKE 1 TABLET (25 MG TOTAL) BY MOUTH DAILY., Disp: 90 tablet, Rfl: 1   lisinopril  (ZESTRIL ) 10 MG tablet, TAKE 1 TABLET BY MOUTH EVERY DAY, Disp: 90 tablet, Rfl: 0   meloxicam  (MOBIC ) 15 MG tablet, Take 1 tablet (15 mg total) by mouth daily. For 2 weeks for pain and inflammation. Then take as needed, Disp: 30 tablet, Rfl: 0   methocarbamol  (ROBAXIN ) 500 MG tablet, Take 1 tablet (500 mg total) by mouth every 8 (eight) hours as needed for muscle spasms., Disp: 30 tablet, Rfl: 0   amphetamine -dextroamphetamine  (ADDERALL XR) 30 MG 24 hr capsule, Take 1 capsule (30 mg total) by mouth every morning., Disp: 30 capsule, Rfl: 0   amphetamine -dextroamphetamine  (ADDERALL XR) 30 MG 24 hr capsule, Take 1 capsule (30 mg total) by mouth every morning., Disp: 30 capsule, Rfl: 0   amphetamine -dextroamphetamine  (ADDERALL  XR) 30 MG 24 hr capsule, Take 1 capsule (30 mg total) by mouth every morning., Disp: 30 capsule, Rfl: 0   amphetamine -dextroamphetamine  (ADDERALL) 10 MG tablet, Take 1 tablet at 2 pm if needed., Disp: 30 tablet, Rfl: 0   amphetamine -dextroamphetamine  (ADDERALL) 10 MG tablet, Take 1 tablet at 2 pm if needed., Disp: 30 tablet, Rfl: 0   amphetamine -dextroamphetamine  (ADDERALL) 10 MG tablet, Take 1 tablet at 2 pm  if needed., Disp: 30 tablet, Rfl: 0  EXAM:   VITALS per patient if applicable: None reported  GENERAL: alert, oriented, appears well and in no acute distress  HEENT: atraumatic, conjunttiva clear, no obvious abnormalities on inspection of external nose and ears  NECK: normal movements of the head and neck  LUNGS: on inspection no signs of respiratory distress, breathing rate appears normal, no obvious gross increased work of breathing, gasping or wheezing  CV: no obvious cyanosis  MS: moves all visible extremities without noticeable abnormality  PSYCH/NEURO: pleasant and cooperative, no obvious depression or anxiety, speech and thought processing grossly intact  ASSESSMENT AND PLAN:   Attention deficit hyperactivity disorder (ADHD), predominantly inattentive type - Plan: amphetamine -dextroamphetamine  (ADDERALL XR) 30 MG 24 hr capsule, amphetamine -dextroamphetamine  (ADDERALL XR) 30 MG 24 hr capsule, amphetamine -dextroamphetamine  (ADDERALL XR) 30 MG 24 hr capsule, amphetamine -dextroamphetamine  (ADDERALL) 10 MG tablet, amphetamine -dextroamphetamine  (ADDERALL) 10 MG tablet, amphetamine -dextroamphetamine  (ADDERALL) 10 MG tablet  -PDMP reviewed, no red flags, overdose rescore is 240. - Refill Adderall 30 mg XR to take 1 tablet in the morning and Adderall 10 mg to take 1 tablet in the afternoon as needed for total of 3 months.   I discussed the assessment and treatment plan with the patient. The patient was provided an opportunity to ask questions and all were answered. The patient agreed with the plan and demonstrated an understanding of the instructions.   The patient was advised to call back or seek an in-person evaluation if the symptoms worsen or if the condition fails to improve as anticipated.    Marguerita Shih, MD  [facility] at [facility]

## 2024-04-07 ENCOUNTER — Encounter: Payer: Self-pay | Admitting: Internal Medicine

## 2024-05-17 ENCOUNTER — Encounter: Payer: Self-pay | Admitting: Internal Medicine

## 2024-05-25 ENCOUNTER — Other Ambulatory Visit: Payer: Self-pay | Admitting: Internal Medicine

## 2024-05-25 DIAGNOSIS — I10 Essential (primary) hypertension: Secondary | ICD-10-CM

## 2024-06-20 ENCOUNTER — Encounter: Payer: Self-pay | Admitting: Internal Medicine

## 2024-06-20 DIAGNOSIS — F900 Attention-deficit hyperactivity disorder, predominantly inattentive type: Secondary | ICD-10-CM

## 2024-06-21 MED ORDER — AMPHETAMINE-DEXTROAMPHET ER 30 MG PO CP24
30.0000 mg | ORAL_CAPSULE | ORAL | 0 refills | Status: DC
Start: 2024-06-21 — End: 2024-07-26

## 2024-06-21 MED ORDER — AMPHETAMINE-DEXTROAMPHETAMINE 10 MG PO TABS: ORAL_TABLET | ORAL | 0 refills | Status: DC

## 2024-07-24 ENCOUNTER — Other Ambulatory Visit: Payer: Self-pay | Admitting: Internal Medicine

## 2024-07-24 DIAGNOSIS — I10 Essential (primary) hypertension: Secondary | ICD-10-CM

## 2024-07-25 ENCOUNTER — Encounter: Payer: Self-pay | Admitting: Internal Medicine

## 2024-07-25 DIAGNOSIS — F900 Attention-deficit hyperactivity disorder, predominantly inattentive type: Secondary | ICD-10-CM

## 2024-07-25 NOTE — Telephone Encounter (Signed)
 Last visit was an virtual visit 03/02/24.  Okay to fill?

## 2024-07-26 MED ORDER — AMPHETAMINE-DEXTROAMPHET ER 30 MG PO CP24: 30.0000 mg | ORAL_CAPSULE | ORAL | 0 refills | Status: DC

## 2024-07-26 MED ORDER — AMPHETAMINE-DEXTROAMPHET ER 30 MG PO CP24
30.0000 mg | ORAL_CAPSULE | ORAL | 0 refills | Status: DC
Start: 2024-07-26 — End: 2024-09-11

## 2024-07-26 MED ORDER — AMPHETAMINE-DEXTROAMPHETAMINE 10 MG PO TABS: ORAL_TABLET | ORAL | 0 refills | Status: DC

## 2024-09-05 ENCOUNTER — Ambulatory Visit: Payer: Self-pay | Admitting: Internal Medicine

## 2024-09-05 ENCOUNTER — Other Ambulatory Visit: Payer: Self-pay | Admitting: Internal Medicine

## 2024-09-05 DIAGNOSIS — F900 Attention-deficit hyperactivity disorder, predominantly inattentive type: Secondary | ICD-10-CM

## 2024-09-05 NOTE — Telephone Encounter (Signed)
 Copied from CRM [id number]. Topic: Clinical - Medication Question >> Sep 05, 2024 11:26 AM Eva FALCON wrote: Reason for CRM: Patient missed appointment today due to emergency. He rescheduled for Monday 11/3. He was wondering if he could get a refill of his adderall to last until then? If so [facility] [address]. Please call back if this is able to be done.

## 2024-09-11 ENCOUNTER — Encounter: Payer: Self-pay | Admitting: Internal Medicine

## 2024-09-11 ENCOUNTER — Telehealth: Payer: Self-pay | Admitting: Internal Medicine

## 2024-09-11 VITALS — BP 118/82 | Ht 68.0 in | Wt 181.0 lb

## 2024-09-11 DIAGNOSIS — F900 Attention-deficit hyperactivity disorder, predominantly inattentive type: Secondary | ICD-10-CM

## 2024-09-11 DIAGNOSIS — I10 Essential (primary) hypertension: Secondary | ICD-10-CM

## 2024-09-11 MED ORDER — LISINOPRIL 10 MG PO TABS: 10.0000 mg | ORAL_TABLET | Freq: Every day | ORAL | 0 refills | Status: AC

## 2024-09-11 MED ORDER — AMPHETAMINE-DEXTROAMPHETAMINE 10 MG PO TABS: ORAL_TABLET | ORAL | 0 refills | Status: AC

## 2024-09-11 MED ORDER — AMPHETAMINE-DEXTROAMPHET ER 30 MG PO CP24: 30.0000 mg | ORAL_CAPSULE | ORAL | 0 refills | Status: AC

## 2024-09-11 MED ORDER — HYDROCHLOROTHIAZIDE 25 MG PO TABS: 25.0000 mg | ORAL_TABLET | Freq: Every day | ORAL | 0 refills | Status: AC

## 2024-09-11 NOTE — Progress Notes (Signed)
 Virtual Visit via Video Note  I connected with Walter Roberson on 09/11/24 at  9:00 AM EST by a video enabled telemedicine application and verified that I am speaking with the correct person using two identifiers.  Location patient: home Location provider: work office Persons participating in the virtual visit: patient, provider  I discussed the limitations of evaluation and management by telemedicine and the availability of in person appointments. The patient expressed understanding and agreed to proceed.   HPI: Scheduled visit for purpose of medication refills per protocol.  Has ADHD and takes Adderall 30 mg XR in the morning and an additional 10 mg in the afternoon if needed.  Is also requesting refills of blood pressure medication.   ROS: Negative unless indicated in HPI.  Past Medical History:  Diagnosis Date   ADHD    HTN (hypertension)    Overweight (BMI 25.0-29.9)     Past Surgical History:  Procedure Laterality Date   CARPAL TUNNEL RELEASE Right    CHONDROPLASTY Left 09/10/2021   Procedure: CHONDROPLASTY;  Surgeon: Cristy Bonner DASEN, MD;  Location: [facility];  Service: Orthopedics;  Laterality: Left;   FINGER CLOSED REDUCTION     KNEE ARTHROSCOPY Left 09/10/2021   Procedure: ARTHROSCOPY KNEE WITH LOOSE BODY EXCISION;  Surgeon: Cristy Bonner DASEN, MD;  Location: [facility];  Service: Orthopedics;  Laterality: Left;   KNEE ARTHROSCOPY Left 07/23/2022   Procedure: ARTHROSCOPY KNEE CHONDROPLASTY;  Surgeon: Cristy Bonner DASEN, MD;  Location: [facility];  Service: Orthopedics;  Laterality: Left;   KNEE ARTHROSCOPY WITH OSTEOCHONDRAL DEFECT REPAIR Left 03/01/2019   Procedure: LEFT KNEE ARTHROSCOPY WITH OSTEOCHARAL DEFECT FIXATION MICROFRACTURE;  Surgeon: Rubie Kemps, MD;  Location: [facility];  Service: Orthopedics;  Laterality: Left;   ORIF ANKLE FRACTURE BIMALLEOLAR Left    x2   OSTEOCHONDRAL DEFECT REPAIR/RECONSTRUCTION  Left 09/10/2021   Procedure: OSTEOCHONDRAL DEFECT REPAIR/RECONSTRUCTION;  Surgeon: Cristy Bonner DASEN, MD;  Location: [facility];  Service: Orthopedics;  Laterality: Left;   REPAIR ANKLE LIGAMENT Left     Family History  Problem Relation Age of Onset   Hypertension Father    Diabetes Brother     SOCIAL HX:   reports that he has never smoked. He has never used smokeless tobacco. He reports current alcohol use of about 4.0 standard drinks of alcohol per week. He reports that he does not use drugs.   Current Outpatient Medications:    amoxicillin -clavulanate (AUGMENTIN ) 875-125 MG tablet, Take 1 tablet by mouth every 12 (twelve) hours. (Patient not taking: Reported on 09/11/2024), Disp: 14 tablet, Rfl: 0   amphetamine -dextroamphetamine  (ADDERALL XR) 30 MG 24 hr capsule, Take 1 capsule (30 mg total) by mouth every morning., Disp: 30 capsule, Rfl: 0   amphetamine -dextroamphetamine  (ADDERALL XR) 30 MG 24 hr capsule, Take 1 capsule (30 mg total) by mouth every morning., Disp: 30 capsule, Rfl: 0   amphetamine -dextroamphetamine  (ADDERALL XR) 30 MG 24 hr capsule, Take 1 capsule (30 mg total) by mouth every morning., Disp: 30 capsule, Rfl: 0   amphetamine -dextroamphetamine  (ADDERALL) 10 MG tablet, Take 1 tablet at 2 pm if needed., Disp: 30 tablet, Rfl: 0   amphetamine -dextroamphetamine  (ADDERALL) 10 MG tablet, Take 1 tablet at 2 pm if needed., Disp: 30 tablet, Rfl: 0   amphetamine -dextroamphetamine  (ADDERALL) 10 MG tablet, Take 1 tablet at 2 pm if needed., Disp: 30 tablet, Rfl: 0   hydrochlorothiazide  (HYDRODIURIL ) 25 MG tablet, Take 1 tablet (25 mg  total) by mouth daily., Disp: 90 tablet, Rfl: 0   lisinopril  (ZESTRIL ) 10 MG tablet, Take 1 tablet (10 mg total) by mouth daily., Disp: 90 tablet, Rfl: 0   meloxicam  (MOBIC ) 15 MG tablet, Take 1 tablet (15 mg total) by mouth daily. For 2 weeks for pain and inflammation. Then take as needed (Patient not taking: Reported on 09/11/2024), Disp: 30  tablet, Rfl: 0   methocarbamol  (ROBAXIN ) 500 MG tablet, Take 1 tablet (500 mg total) by mouth every 8 (eight) hours as needed for muscle spasms. (Patient not taking: Reported on 09/11/2024), Disp: 30 tablet, Rfl: 0  EXAM:   VITALS per patient if applicable: None reported  GENERAL: alert, oriented, appears well and in no acute distress  HEENT: atraumatic, conjunttiva clear, no obvious abnormalities on inspection of external nose and ears  NECK: normal movements of the head and neck  LUNGS: on inspection no signs of respiratory distress, breathing rate appears normal, no obvious gross increased work of breathing, gasping or wheezing  CV: no obvious cyanosis  MS: moves all visible extremities without noticeable abnormality  PSYCH/NEURO: pleasant and cooperative, no obvious depression or anxiety, speech and thought processing grossly intact  ASSESSMENT AND PLAN:   Attention deficit hyperactivity disorder (ADHD), predominantly inattentive type - Plan: amphetamine -dextroamphetamine  (ADDERALL XR) 30 MG 24 hr capsule, amphetamine -dextroamphetamine  (ADDERALL) 10 MG tablet, amphetamine -dextroamphetamine  (ADDERALL) 10 MG tablet, amphetamine -dextroamphetamine  (ADDERALL XR) 30 MG 24 hr capsule, amphetamine -dextroamphetamine  (ADDERALL XR) 30 MG 24 hr capsule, amphetamine -dextroamphetamine  (ADDERALL) 10 MG tablet  Primary hypertension  Essential hypertension - Plan: hydrochlorothiazide  (HYDRODIURIL ) 25 MG tablet, lisinopril  (ZESTRIL ) 10 MG tablet  - PDMP reviewed, no red flags, overdose rescore is 240.  Refilled both Adderall XR 30 mg and 10 mg for 3 months. - Per report blood pressure has been well-controlled with systolics in the 110-120 range.   I discussed the assessment and treatment plan with the patient. The patient was provided an opportunity to ask questions and all were answered. The patient agreed with the plan and demonstrated an understanding of the instructions.   The patient was  advised to call back or seek an in-person evaluation if the symptoms worsen or if the condition fails to improve as anticipated.    Tully Theophilus Andrews, MD  [facility]

## 2024-10-09 ENCOUNTER — Encounter: Payer: Self-pay | Admitting: Internal Medicine

## 2024-11-14 ENCOUNTER — Encounter: Payer: Self-pay | Admitting: Internal Medicine

## 2024-11-16 ENCOUNTER — Other Ambulatory Visit: Payer: Self-pay | Admitting: Internal Medicine

## 2024-11-16 DIAGNOSIS — F900 Attention-deficit hyperactivity disorder, predominantly inattentive type: Secondary | ICD-10-CM

## 2024-11-16 NOTE — Telephone Encounter (Signed)
 Copied from CRM [id number]. Topic: Clinical - Medication Refill >> Nov 16, 2024  9:54 AM Alfonso HERO wrote: Medication: amphetamine -dextroamphetamine  (ADDERALL XR) 30 MG 24 hr capsule  amphetamine -dextroamphetamine  (ADDERALL) 10 MG tablet     Has the patient contacted their pharmacy? No (Agent: If no, request that the patient contact the pharmacy for the refill. If patient does not wish to contact the pharmacy document the reason why and proceed with request.) (Agent: If yes, when and what did the pharmacy advise?)  This is the patient's preferred pharmacy:  [id number] - [place], [place] - [address] AT [facility] [address] [place] [address] Phone: [phone] Fax: [phone]  Is this the correct pharmacy for this prescription? Yes If no, delete pharmacy and type the correct one.   Has the prescription been filled recently? Yes  Is the patient out of the medication? Yes  Has the patient been seen for an appointment in the last year OR does the patient have an upcoming appointment? Yes  Can we respond through MyChart? Yes  Agent: Please be advised that Rx refills may take up to 3 business days. We ask that you follow-up with your pharmacy.
